# Patient Record
Sex: Male | Born: 1950 | Race: White | Hispanic: No | Marital: Married | Smoking: Former smoker
Health system: Southern US, Community
[De-identification: ages and names within clinical notes are randomized; demographics above are authoritative.]

## PROBLEM LIST (undated history)

## (undated) DIAGNOSIS — M1990 Unspecified osteoarthritis, unspecified site: Secondary | ICD-10-CM

## (undated) DIAGNOSIS — E039 Hypothyroidism, unspecified: Secondary | ICD-10-CM

## (undated) DIAGNOSIS — E785 Hyperlipidemia, unspecified: Secondary | ICD-10-CM

## (undated) DIAGNOSIS — G4730 Sleep apnea, unspecified: Secondary | ICD-10-CM

## (undated) DIAGNOSIS — T4145XA Adverse effect of unspecified anesthetic, initial encounter: Secondary | ICD-10-CM

## (undated) DIAGNOSIS — I639 Cerebral infarction, unspecified: Secondary | ICD-10-CM

## (undated) DIAGNOSIS — I10 Essential (primary) hypertension: Secondary | ICD-10-CM

## (undated) DIAGNOSIS — T8859XA Other complications of anesthesia, initial encounter: Secondary | ICD-10-CM

## (undated) DIAGNOSIS — F99 Mental disorder, not otherwise specified: Secondary | ICD-10-CM

## (undated) HISTORY — DX: Hyperlipidemia, unspecified: E78.5

## (undated) HISTORY — PX: COLONOSCOPY: SHX174

## (undated) HISTORY — PX: TONSILLECTOMY: SUR1361

## (undated) HISTORY — DX: Essential (primary) hypertension: I10

## (undated) HISTORY — PX: KNEE SURGERY: SHX244

---

## 2010-07-15 ENCOUNTER — Encounter: Payer: Self-pay | Admitting: Internal Medicine

## 2010-07-15 ENCOUNTER — Ambulatory Visit (INDEPENDENT_AMBULATORY_CARE_PROVIDER_SITE_OTHER): Payer: PRIVATE HEALTH INSURANCE | Admitting: Internal Medicine

## 2010-07-15 VITALS — BP 108/70 | HR 55 | Temp 97.9°F | Ht 76.0 in | Wt 259.0 lb

## 2010-07-15 DIAGNOSIS — R0989 Other specified symptoms and signs involving the circulatory and respiratory systems: Secondary | ICD-10-CM

## 2010-07-15 DIAGNOSIS — R0600 Dyspnea, unspecified: Secondary | ICD-10-CM

## 2010-07-15 NOTE — Progress Notes (Signed)
  Subjective:    Patient ID: Cameron Jackson, male    DOB: Jul 03, 1950, 60 y.o.   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  07/15/2010  Initial pulmonary office eval  Cc abrupt onset coug/sob one month ago with ear congestion then cough which developed one day after gagging episode attributed to eating / drinking too much the night before > dysphagia then cough progressively worse > ER admitted x 3 days dx aecopd with neg ct chest,  rx abx and steroids >  Now off and now 95% better.    At ov Pt denies any significant sore throat, dysphagia, itching, sneezing,  nasal congestion or excess/ purulent secretions,  fever, chills, sweats, unintended wt loss, pleuritic or exertional cp, hempoptysis, orthopnea pnd or leg swelling.    Also denies any obvious fluctuation of symptoms with weather or environmental changes or other aggravating or alleviating factors.      Review of Systems  Constitutional: Negative for fever, chills, activity change, appetite change and unexpected weight change.  HENT: Negative for congestion, sore throat, rhinorrhea, sneezing, trouble swallowing, dental problem, voice change and postnasal drip.   Eyes: Negative for visual disturbance.  Respiratory: Positive for cough and shortness of breath. Negative for choking.   Cardiovascular: Negative for chest pain and leg swelling.  Gastrointestinal: Negative for nausea, vomiting and abdominal pain.  Genitourinary: Negative for difficulty urinating.  Musculoskeletal: Negative for arthralgias.  Skin: Negative for rash.  Psychiatric/Behavioral: Negative for behavioral problems and confusion.       Objective:   Physical Exam Wt 259 07/15/2010  HEENT mild turbinate edema.  Oropharynx no thrush or excess pnd or cobblestoning.   No JVD or cervical adenopathy. Mild accessory muscle hypertrophy. Trachea midline, nl thryroid. Chest was hyperinflated by percussion with diminished breath sounds and moderate increased exp time without wheeze. Hoover sign positive at mid inspiration. Regular rate and rhythm without murmur gallop or rub or increase P2 or edema.  Abd: no hsm, nl excursion. Ext warm without cyanosis or clubbing.         Assessment & Plan:

## 2010-07-15 NOTE — Assessment & Plan Note (Signed)
DDX of  difficult airways managment all start with A and  include Adherence, Ace Inhibitors, Acid Reflux, Active Sinus Disease, Alpha 1 Antitripsin deficiency, Anxiety masquerading as Airways dz,  ABPA,  allergy(esp in young), Aspiration (esp in elderly), Adverse effects of DPI,  Active smokers, plus two Bs  = Bronchiectasis and Beta blocker use..and one C= CHF  In retrospect this was classic upper airway cough syndrome brought on by overt acid reflux in a pt on CCB with sign abd wt gain and dietary indiscretion.    Classic Upper airway cough syndrome, so named because it's frequently impossible to sort out how much is  CR/sinusitis with freq throat clearing (which can be related to primary GERD)   vs  causing  secondary (" extra esophageal")  GERD from wide swings in gastric pressure that occur with throat clearing, often  promoting self use of mint and menthol lozenges that reduce the lower esophageal sphincter tone and exacerbate the problem further in a cyclical fashion.   These are the same pts who not infrequently have failed to tolerate ace inhibitors,  dry powder inhalers or biphosphonates or report having reflux symptoms that don't respond to standard doses of PPI , and are easily confused as having aecopd or asthma flares,   ? Is there any sign underlying primary airway problem so needs diet/ lifestyle changes and return for pft's

## 2010-07-15 NOTE — Patient Instructions (Addendum)
GERD (REFLUX)  is an extremely common cause of respiratory symptoms, many times with no significant heartburn at all.    It can be treated with medication, but also with lifestyle changes including avoidance of late meals, excessive alcohol, smoking cessation, and avoid fatty foods, chocolate, peppermint, colas, red wine, and acidic juices such as orange juice.  NO MINT OR MENTHOL PRODUCTS SO NO COUGH DROPS  USE SUGARLESS CANDY INSTEAD (jolley ranchers or Stover's)  NO OIL BASED VITAMINS   You may need another blood pressure pill because amlodipine may interfere with the valve that prevents reflux  Please schedule a follow up office visit in 4 weeks, sooner if needed with PFT's and maybe cxr after we review the xrays from [place]  Late add: xrays, ct reviewed > no f/u needed

## 2010-07-28 ENCOUNTER — Encounter: Payer: Self-pay | Admitting: Internal Medicine

## 2010-08-12 ENCOUNTER — Ambulatory Visit (INDEPENDENT_AMBULATORY_CARE_PROVIDER_SITE_OTHER): Payer: PRIVATE HEALTH INSURANCE | Admitting: Internal Medicine

## 2010-08-12 ENCOUNTER — Encounter: Payer: Self-pay | Admitting: Internal Medicine

## 2010-08-12 VITALS — BP 118/80 | HR 85 | Temp 98.6°F | Ht 76.0 in | Wt 258.0 lb

## 2010-08-12 DIAGNOSIS — R0600 Dyspnea, unspecified: Secondary | ICD-10-CM

## 2010-08-12 DIAGNOSIS — R0609 Other forms of dyspnea: Secondary | ICD-10-CM

## 2010-08-12 LAB — PULMONARY FUNCTION TEST

## 2010-08-12 NOTE — Progress Notes (Signed)
PFT done today. 

## 2010-08-12 NOTE — Patient Instructions (Addendum)
Prilosec 20mg  Take 30-60 min before first meal of the day and Pepcid 20mg  one at bedtime for a month  Add chlortrimeton 4 mg one in the evening before bedtime   If not improved the first thing I would do is change norvasc to ARB or bystolic for a month and then return here if not satisfied.  NB the  ramp to expected improvement in symptoms from an empiric trial of acid reflux (and for that matter, worsening, if a chronic effective medication is stopped)  can be measured in weeks, not days, a common misconception because this is not the same as treating heartburn (no immediate cause and effect relationship)  so that response to therapy or lack thereof can be very difficult to assess especially if the patient is not adherent to the treatment plan which includes dietary restrictions.    If you are satisfied with your treatment plan let your doctor know and he/she can either refill your medications or you can return here when your prescription runs out.     If in any way you are not 100% satisfied,  please tell us.  If 100% better, tell your friends!

## 2010-08-12 NOTE — Assessment & Plan Note (Signed)
In retrospect clear now that this was  Classic Upper airway cough syndrome, so named because it's frequently impossible to sort out how much is  CR/sinusitis with freq throat clearing (which can be related to primary GERD)   vs  causing  secondary (" extra esophageal")  GERD from wide swings in gastric pressure that occur with throat clearing, often  promoting self use of mint and menthol lozenges that reduce the lower esophageal sphincter tone and exacerbate the problem further in a cyclical fashion.   These are the same pts who not infrequently have failed to tolerate ace inhibitors,  dry powder inhalers or biphosphonates or report having reflux symptoms that don't respond to standard doses of PPI , and are easily confused as having aecopd or asthma flares,   He does not have copd and probably not primary asthma either  Key is to stop the compulsive throat clearing and try max acid suppression then maybe off norvasc (which lowers LES and may promote non acid gerd/lpr)  Before next pulmonary re-eval

## 2010-08-12 NOTE — Progress Notes (Signed)
  Subjective:    Patient ID: Cameron Jackson, male    DOB: [birth year], 60 y.o.   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 and with sometimes with steps starting around fall 2011 assoc with about 25 lb wt gain x preceding 5 years with nl pft's 08/12/2010   07/15/2010 Initial pulmonary office eval Cc abrupt onset coug/sob one month ago with ear congestion then cough which developed one day after gagging episode attributed to eating / drinking too much the night before > dysphagia then cough progressively worse > ER admitted x 3 days dx aecopd with neg ct chest, rx abx and steroids > Now off and now 95% better  08/12/2010 ov /Ahman [facility] ov for pft's  On no meds x norvasc all better x throat drainage worse when lie down, no excess mucus. Sleeping ok without nocturnal  or early am exac of resp c/o's or need for noct saba.  Pt denies any significant sore throat, dysphagia, itching, sneezing,  nasal congestion or excess/ purulent nasal secretions,  fever, chills, sweats, unintended wt loss, pleuritic or exertional cp, hempoptysis, orthopnea pnd or leg swelling.    Also denies any obvious fluctuation of symptoms with weather or environmental changes or other aggravating or alleviating factors.     Review of Systems     Objective:   Physical Exam      Wt 259 07/15/2010  > 258 08/12/2010   amb wm with freq throat clearing HEENT: nl dentition, turbinates, and orophanx. Nl external ear canals without cough reflex   NECK :  without JVD/Nodes/TM/ nl carotid upstrokes bilaterally   LUNGS: no acc muscle use, clear to A and P bilaterally without cough on insp or exp maneuvers   CV:  RRR  no s3 or murmur or increase in P2, no edema   ABD:  soft and nontender with nl excursion in the supine position. No bruits or organomegaly, bowel sounds nl  MS:  warm without deformities, calf tenderness, cyanosis or  clubbing  SKIN: warm and dry without lesions            Assessment & Plan:

## 2010-08-25 ENCOUNTER — Encounter: Payer: Self-pay | Admitting: Internal Medicine

## 2013-02-01 ENCOUNTER — Other Ambulatory Visit: Payer: Self-pay | Admitting: Neurosurgery

## 2013-02-01 DIAGNOSIS — M4802 Spinal stenosis, cervical region: Secondary | ICD-10-CM

## 2013-02-07 ENCOUNTER — Ambulatory Visit
Admission: RE | Admit: 2013-02-07 | Discharge: 2013-02-07 | Disposition: A | Discharge status: Home / Self Care | Payer: PRIVATE HEALTH INSURANCE | Source: Ambulatory Visit | Attending: Neurosurgery | Admitting: Neurosurgery

## 2013-02-07 VITALS — BP 150/61 | HR 56

## 2013-02-07 DIAGNOSIS — M4802 Spinal stenosis, cervical region: Secondary | ICD-10-CM

## 2013-02-07 MED ORDER — ONDANSETRON HCL 4 MG/2ML IJ SOLN: 4.0000 mg | Freq: Four times a day (QID) | INTRAMUSCULAR | Status: DC | PRN

## 2013-02-07 MED ORDER — DIAZEPAM 5 MG PO TABS
10.0000 mg | ORAL_TABLET | Freq: Once | ORAL | Status: AC
  Administered 2013-02-07: 10 mg via ORAL

## 2013-02-07 MED ORDER — IOHEXOL 300 MG/ML  SOLN
10.0000 mL | Freq: Once | INTRAMUSCULAR | Status: AC | PRN
  Administered 2013-02-07: 10 mL via INTRAVENOUS

## 2013-02-07 NOTE — Progress Notes (Signed)
Pt states he has been off citalopram and lexapro since last Saturday. Discharge instructions explained to pt.

## 2013-02-21 ENCOUNTER — Other Ambulatory Visit: Payer: Self-pay | Admitting: Neurosurgery

## 2013-02-23 ENCOUNTER — Encounter (HOSPITAL_COMMUNITY): Payer: Self-pay | Admitting: Pharmacy Technician

## 2013-02-26 ENCOUNTER — Encounter (HOSPITAL_COMMUNITY)
Admission: RE | Admit: 2013-02-26 | Discharge: 2013-02-26 | Disposition: A | Discharge status: Home / Self Care | Payer: PRIVATE HEALTH INSURANCE | Source: Ambulatory Visit | Attending: Neurosurgery | Admitting: Neurosurgery

## 2013-02-26 ENCOUNTER — Ambulatory Visit (HOSPITAL_COMMUNITY)
Admission: RE | Admit: 2013-02-26 | Discharge: 2013-02-26 | Disposition: A | Discharge status: Home / Self Care | Payer: PRIVATE HEALTH INSURANCE | Source: Ambulatory Visit | Attending: Neurosurgery | Admitting: Neurosurgery

## 2013-02-26 ENCOUNTER — Encounter (HOSPITAL_COMMUNITY): Payer: Self-pay

## 2013-02-26 DIAGNOSIS — Z01818 Encounter for other preprocedural examination: Secondary | ICD-10-CM | POA: Insufficient documentation

## 2013-02-26 HISTORY — DX: Hypothyroidism, unspecified: E03.9

## 2013-02-26 HISTORY — DX: Cerebral infarction, unspecified: I63.9

## 2013-02-26 HISTORY — DX: Adverse effect of unspecified anesthetic, initial encounter: T41.45XA

## 2013-02-26 HISTORY — DX: Sleep apnea, unspecified: G47.30

## 2013-02-26 HISTORY — DX: Unspecified osteoarthritis, unspecified site: M19.90

## 2013-02-26 HISTORY — DX: Mental disorder, not otherwise specified: F99

## 2013-02-26 HISTORY — DX: Other complications of anesthesia, initial encounter: T88.59XA

## 2013-02-26 LAB — CBC
Platelets: 216 10*3/uL (ref 150–400)
RBC: 4.67 MIL/uL (ref 4.22–5.81)
RDW: 12.4 % (ref 11.5–15.5)
WBC: 5.9 10*3/uL (ref 4.0–10.5)

## 2013-02-26 LAB — BASIC METABOLIC PANEL
CO2: 26 mEq/L (ref 19–32)
Calcium: 9.2 mg/dL (ref 8.4–10.5)
Chloride: 103 mEq/L (ref 96–112)
Creatinine, Ser: 1.05 mg/dL (ref 0.50–1.35)
GFR calc Af Amer: 87 mL/min — ABNORMAL LOW (ref >90)
Potassium: 4 mEq/L (ref 3.5–5.1)
Sodium: 138 mEq/L (ref 135–145)

## 2013-02-26 LAB — SURGICAL PCR SCREEN
MRSA, PCR: NEGATIVE
Staphylococcus aureus: POSITIVE — AB

## 2013-02-26 NOTE — Pre-Procedure Instructions (Signed)
Cabe Lashley  02/26/2013   Your procedure is scheduled on:  Wednesday, November 26th.  Report to [facility], Main Entrance / Entrance "A" at 11:15 AM.  Call this number if you have problems the morning of surgery: [phone]   Remember:   Do not eat food or drink liquids after midnight on Tuesday.  Take these medicines the morning of surgery with A SIP OF WATER: Escitalopram (Lexapro), Levothyroxine (Synthroid).    Do not wear jewelry, make-up or nail polish.  Do not wear lotions, powders, or perfumes. You may wear deodorant.   Men may shave face and neck.  Do not bring valuables to the hospital.  [facility] is not responsible for any belongings or valuables.               Contacts, dentures or bridgework may not be worn into surgery.  Leave suitcase in the car. After surgery it may be brought to your room.  For patients admitted to the hospital, discharge time is determined by your treatment team.               Patients discharged the day of surgery will not be allowed to drive home.  Name and phone number of your driver: -  Special Instructions: Shower using CHG 2 nights before surgery and the night before surgery.  If you shower the day of surgery use CHG.  Use special wash - you have one bottle of CHG for all showers.  You should use approximately 1/3 of the bottle for each shower. N/A   Please read over the following fact sheets that you were given: Pain Booklet, Coughing and Deep Breathing and Surgical Site Infection Prevention

## 2013-02-26 NOTE — Progress Notes (Signed)
Patientt states that 20 - 30 years ago he had hypothermia after surgery and woke up in an incubator.  Patient reported that he has become aggressive with staff when he had conscious sedation,  Endoscopy at [facility].  I requested discharge notes from [facility] Endoscopy visit.

## 2013-02-26 NOTE — Pre-Procedure Instructions (Addendum)
Shaheem Pichon  02/26/2013   Your procedure is scheduled on:  Wednesday, November 26th.  Report to [facility], Main Entrance / Entrance "A" at 11:15 AM.  Call this number if you have problems the morning of surgery: [phone]   Remember:   Do not eat food or drink liquids after midnight on Tuesday.  Take these medicines the morning of surgery with A SIP OF WATER: Escitalopram (Lexapro), Levothyroxine (Synthroid).            Stop taking Aspirin, Coumadin, Plavix, Effient and Herbal medications.  Do not take any NSAIDs ie: Ibuprofen,  Advil,Naproxen or any medication containing Aspirin.   Do not wear jewelry, make-up or nail polish.  Do not wear lotions, powders, or perfumes. You may wear deodorant.   Men may shave face and neck.  Do not bring valuables to the hospital.  [facility] is not responsible for any belongings or valuables.               Contacts, dentures or bridgework may not be worn into surgery.  Leave suitcase in the car. After surgery it may be brought to your room.  For patients admitted to the hospital, discharge time is determined by your treatment team.               Patients discharged the day of surgery will not be allowed to drive home.  Name and phone number of your driver: -  Special Instructions: Shower using CHG 2 nights before surgery and the night before surgery.  If you shower the day of surgery use CHG.  Use special wash - you have one bottle of CHG for all showers.  You should use approximately 1/3 of the bottle for each shower. N/A   Please read over the following fact sheets that you were given: Pain Booklet, Coughing and Deep Breathing and Surgical Site Infection Prevention

## 2013-02-27 NOTE — Progress Notes (Signed)
Surgery moved from 1415 to 1520. Per Rosana Berger., RN, Dr. Cassandria Santee office will inform pt of time change; therefore, pt not called.

## 2013-02-27 NOTE — Progress Notes (Signed)
Re- requested sleep study  From dr. Blenda Nicely   .

## 2013-02-27 NOTE — Progress Notes (Signed)
Nurse called patient to see if Dr. Cassandria Santee office called to inform patient of time change. Patient stated no one had called him. Nurse instructed patient to arrive at 1220 instead of 1115. Patient verbalized understanding.

## 2013-02-28 ENCOUNTER — Inpatient Hospital Stay (HOSPITAL_COMMUNITY)
Admission: RE | Admit: 2013-02-28 | Discharge: 2013-03-02 | DRG: 473 | Disposition: A | Discharge status: Home / Self Care | Payer: PRIVATE HEALTH INSURANCE | Source: Ambulatory Visit | Attending: Neurosurgery | Admitting: Neurosurgery

## 2013-02-28 ENCOUNTER — Inpatient Hospital Stay (HOSPITAL_COMMUNITY): Payer: PRIVATE HEALTH INSURANCE | Admitting: Anesthesiology

## 2013-02-28 ENCOUNTER — Encounter (HOSPITAL_COMMUNITY): Payer: Self-pay | Admitting: Anesthesiology

## 2013-02-28 ENCOUNTER — Encounter (HOSPITAL_COMMUNITY)
Admission: RE | Disposition: A | Discharge status: Home / Self Care | Payer: Self-pay | Source: Ambulatory Visit | Attending: Neurosurgery

## 2013-02-28 ENCOUNTER — Encounter (HOSPITAL_COMMUNITY): Payer: PRIVATE HEALTH INSURANCE | Admitting: Vascular Surgery

## 2013-02-28 ENCOUNTER — Inpatient Hospital Stay (HOSPITAL_COMMUNITY): Payer: PRIVATE HEALTH INSURANCE

## 2013-02-28 DIAGNOSIS — M4802 Spinal stenosis, cervical region: Secondary | ICD-10-CM | POA: Diagnosis present

## 2013-02-28 DIAGNOSIS — Z91038 Other insect allergy status: Secondary | ICD-10-CM

## 2013-02-28 DIAGNOSIS — Z7982 Long term (current) use of aspirin: Secondary | ICD-10-CM

## 2013-02-28 DIAGNOSIS — G4730 Sleep apnea, unspecified: Secondary | ICD-10-CM | POA: Diagnosis present

## 2013-02-28 DIAGNOSIS — E785 Hyperlipidemia, unspecified: Secondary | ICD-10-CM | POA: Diagnosis present

## 2013-02-28 DIAGNOSIS — Z8673 Personal history of transient ischemic attack (TIA), and cerebral infarction without residual deficits: Secondary | ICD-10-CM

## 2013-02-28 DIAGNOSIS — E039 Hypothyroidism, unspecified: Secondary | ICD-10-CM | POA: Diagnosis present

## 2013-02-28 DIAGNOSIS — I10 Essential (primary) hypertension: Secondary | ICD-10-CM | POA: Diagnosis present

## 2013-02-28 DIAGNOSIS — Z79899 Other long term (current) drug therapy: Secondary | ICD-10-CM

## 2013-02-28 DIAGNOSIS — Z87891 Personal history of nicotine dependence: Secondary | ICD-10-CM

## 2013-02-28 DIAGNOSIS — Z8249 Family history of ischemic heart disease and other diseases of the circulatory system: Secondary | ICD-10-CM

## 2013-02-28 HISTORY — PX: ANTERIOR CERVICAL DECOMPRESSION/DISCECTOMY FUSION 4 LEVELS: SHX5556

## 2013-02-28 SURGERY — ANTERIOR CERVICAL DECOMPRESSION/DISCECTOMY FUSION 4 LEVELS
Anesthesia: General | Site: Neck | Wound class: Clean

## 2013-02-28 MED ORDER — DEXTROSE 5 % IV SOLN
3.0000 g | INTRAVENOUS | Status: AC
  Administered 2013-02-28: 3 g via INTRAVENOUS
  Filled 2013-02-28: qty 3000

## 2013-02-28 MED ORDER — HYDROMORPHONE HCL PF 1 MG/ML IJ SOLN
0.2500 mg | INTRAMUSCULAR | Status: DC | PRN
  Administered 2013-02-28 (×2): 0.5 mg via INTRAVENOUS

## 2013-02-28 MED ORDER — SODIUM CHLORIDE 0.9 % IJ SOLN
3.0000 mL | Freq: Two times a day (BID) | INTRAMUSCULAR | Status: DC
  Administered 2013-03-01 – 2013-03-02 (×2): 3 mL via INTRAVENOUS

## 2013-02-28 MED ORDER — PROPOFOL 10 MG/ML IV BOLUS
INTRAVENOUS | Status: DC | PRN
  Administered 2013-02-28: 60 mg via INTRAVENOUS
  Administered 2013-02-28: 240 mg via INTRAVENOUS

## 2013-02-28 MED ORDER — PHENOL 1.4 % MT LIQD
1.0000 | OROMUCOSAL | Status: DC | PRN
  Filled 2013-02-28 (×2): qty 177

## 2013-02-28 MED ORDER — EPHEDRINE SULFATE 50 MG/ML IJ SOLN
INTRAMUSCULAR | Status: DC | PRN
  Administered 2013-02-28 (×2): 10 mg via INTRAVENOUS

## 2013-02-28 MED ORDER — LACTATED RINGERS IV SOLN
INTRAVENOUS | Status: DC | PRN
  Administered 2013-02-28 (×2): via INTRAVENOUS

## 2013-02-28 MED ORDER — ACETAMINOPHEN 650 MG RE SUPP: 650.0000 mg | RECTAL | Status: DC | PRN

## 2013-02-28 MED ORDER — MIDAZOLAM HCL 5 MG/5ML IJ SOLN
INTRAMUSCULAR | Status: DC | PRN
  Administered 2013-02-28: 2 mg via INTRAVENOUS

## 2013-02-28 MED ORDER — ACETAMINOPHEN 325 MG PO TABS: 650.0000 mg | ORAL_TABLET | ORAL | Status: DC | PRN

## 2013-02-28 MED ORDER — OXYCODONE HCL 5 MG PO TABS: 5.0000 mg | ORAL_TABLET | Freq: Once | ORAL | Status: DC | PRN

## 2013-02-28 MED ORDER — CEFAZOLIN SODIUM-DEXTROSE 2-3 GM-% IV SOLR: 2.0000 g | INTRAVENOUS | Status: DC

## 2013-02-28 MED ORDER — CEFAZOLIN SODIUM 1-5 GM-% IV SOLN
1.0000 g | Freq: Three times a day (TID) | INTRAVENOUS | Status: AC
  Administered 2013-03-01 (×2): 1 g via INTRAVENOUS
  Filled 2013-02-28 (×2): qty 50

## 2013-02-28 MED ORDER — HYDROMORPHONE HCL PF 1 MG/ML IJ SOLN
INTRAMUSCULAR | Status: AC
  Filled 2013-02-28: qty 1

## 2013-02-28 MED ORDER — NEOSTIGMINE METHYLSULFATE 1 MG/ML IJ SOLN
INTRAMUSCULAR | Status: DC | PRN
  Administered 2013-02-28: 3 mg via INTRAVENOUS

## 2013-02-28 MED ORDER — MORPHINE SULFATE (PF) 1 MG/ML IV SOLN
INTRAVENOUS | Status: DC
  Administered 2013-02-28: 22:00:00 via INTRAVENOUS
  Administered 2013-03-01: 19.5 mg via INTRAVENOUS
  Administered 2013-03-01: 18.67 mg via INTRAVENOUS
  Administered 2013-03-01: 03:00:00 via INTRAVENOUS
  Administered 2013-03-01: 9 mg via INTRAVENOUS
  Filled 2013-02-28 (×2): qty 25

## 2013-02-28 MED ORDER — ONDANSETRON HCL 4 MG/2ML IJ SOLN: 4.0000 mg | Freq: Four times a day (QID) | INTRAMUSCULAR | Status: DC | PRN

## 2013-02-28 MED ORDER — LEVOTHYROXINE SODIUM 125 MCG PO TABS
125.0000 ug | ORAL_TABLET | Freq: Every day | ORAL | Status: DC
  Administered 2013-03-01 – 2013-03-02 (×2): 125 ug via ORAL
  Filled 2013-02-28 (×3): qty 1

## 2013-02-28 MED ORDER — ZOLPIDEM TARTRATE 5 MG PO TABS: 10.0000 mg | ORAL_TABLET | Freq: Every evening | ORAL | Status: DC | PRN

## 2013-02-28 MED ORDER — LACTATED RINGERS IV SOLN
INTRAVENOUS | Status: DC
  Administered 2013-02-28 (×2): via INTRAVENOUS

## 2013-02-28 MED ORDER — ROCURONIUM BROMIDE 100 MG/10ML IV SOLN
INTRAVENOUS | Status: DC | PRN
  Administered 2013-02-28: 50 mg via INTRAVENOUS
  Administered 2013-02-28: 20 mg via INTRAVENOUS

## 2013-02-28 MED ORDER — SODIUM CHLORIDE 0.9 % IJ SOLN: 3.0000 mL | INTRAMUSCULAR | Status: DC | PRN

## 2013-02-28 MED ORDER — DIPHENHYDRAMINE HCL 12.5 MG/5ML PO ELIX: 12.5000 mg | ORAL_SOLUTION | Freq: Four times a day (QID) | ORAL | Status: DC | PRN

## 2013-02-28 MED ORDER — DIAZEPAM 5 MG PO TABS
5.0000 mg | ORAL_TABLET | Freq: Four times a day (QID) | ORAL | Status: DC | PRN
  Administered 2013-02-28 – 2013-03-02 (×5): 5 mg via ORAL
  Filled 2013-02-28 (×5): qty 1

## 2013-02-28 MED ORDER — METOCLOPRAMIDE HCL 5 MG/ML IJ SOLN: 10.0000 mg | Freq: Once | INTRAMUSCULAR | Status: DC | PRN

## 2013-02-28 MED ORDER — DIPHENHYDRAMINE HCL 50 MG/ML IJ SOLN: 12.5000 mg | Freq: Four times a day (QID) | INTRAMUSCULAR | Status: DC | PRN

## 2013-02-28 MED ORDER — NALOXONE HCL 0.4 MG/ML IJ SOLN: 0.4000 mg | INTRAMUSCULAR | Status: DC | PRN

## 2013-02-28 MED ORDER — ESCITALOPRAM OXALATE 10 MG PO TABS
10.0000 mg | ORAL_TABLET | Freq: Every day | ORAL | Status: DC
  Administered 2013-03-01 – 2013-03-02 (×2): 10 mg via ORAL
  Filled 2013-02-28 (×2): qty 1

## 2013-02-28 MED ORDER — DEXAMETHASONE SODIUM PHOSPHATE 4 MG/ML IJ SOLN
4.0000 mg | Freq: Four times a day (QID) | INTRAMUSCULAR | Status: DC
  Filled 2013-02-28 (×10): qty 1

## 2013-02-28 MED ORDER — MENTHOL 3 MG MT LOZG
1.0000 | LOZENGE | OROMUCOSAL | Status: DC | PRN
  Filled 2013-02-28: qty 9

## 2013-02-28 MED ORDER — TAMSULOSIN HCL 0.4 MG PO CAPS
0.8000 mg | ORAL_CAPSULE | Freq: Every day | ORAL | Status: DC
  Administered 2013-03-01 – 2013-03-02 (×2): 0.8 mg via ORAL
  Filled 2013-02-28 (×2): qty 2

## 2013-02-28 MED ORDER — SUCCINYLCHOLINE CHLORIDE 20 MG/ML IJ SOLN
INTRAMUSCULAR | Status: DC | PRN
  Administered 2013-02-28: 100 mg via INTRAVENOUS

## 2013-02-28 MED ORDER — SODIUM CHLORIDE 0.9 % IV SOLN: 250.0000 mL | INTRAVENOUS | Status: DC

## 2013-02-28 MED ORDER — MORPHINE SULFATE (PF) 1 MG/ML IV SOLN
INTRAVENOUS | Status: AC
  Filled 2013-02-28: qty 25

## 2013-02-28 MED ORDER — ONDANSETRON HCL 4 MG/2ML IJ SOLN: 4.0000 mg | INTRAMUSCULAR | Status: DC | PRN

## 2013-02-28 MED ORDER — OXYCODONE HCL 5 MG/5ML PO SOLN: 5.0000 mg | Freq: Once | ORAL | Status: DC | PRN

## 2013-02-28 MED ORDER — SODIUM CHLORIDE 0.9 % IV SOLN
INTRAVENOUS | Status: DC
  Administered 2013-02-28 – 2013-03-01 (×2): via INTRAVENOUS

## 2013-02-28 MED ORDER — DEXAMETHASONE 4 MG PO TABS
4.0000 mg | ORAL_TABLET | Freq: Four times a day (QID) | ORAL | Status: DC
  Administered 2013-03-01 – 2013-03-02 (×7): 4 mg via ORAL
  Filled 2013-02-28 (×10): qty 1

## 2013-02-28 MED ORDER — OXYCODONE-ACETAMINOPHEN 5-325 MG PO TABS
1.0000 | ORAL_TABLET | ORAL | Status: DC | PRN
  Administered 2013-03-01 – 2013-03-02 (×5): 2 via ORAL
  Filled 2013-02-28 (×5): qty 2

## 2013-02-28 MED ORDER — THROMBIN 20000 UNITS EX SOLR
CUTANEOUS | Status: DC | PRN
  Administered 2013-02-28: 18:00:00 via TOPICAL

## 2013-02-28 MED ORDER — DEXAMETHASONE SODIUM PHOSPHATE 4 MG/ML IJ SOLN
INTRAMUSCULAR | Status: DC | PRN
  Administered 2013-02-28: 4 mg via INTRAVENOUS

## 2013-02-28 MED ORDER — GLYCOPYRROLATE 0.2 MG/ML IJ SOLN
INTRAMUSCULAR | Status: DC | PRN
  Administered 2013-02-28: 0.4 mg via INTRAVENOUS
  Administered 2013-02-28: 0.2 mg via INTRAVENOUS

## 2013-02-28 MED ORDER — SODIUM CHLORIDE 0.9 % IJ SOLN: 9.0000 mL | INTRAMUSCULAR | Status: DC | PRN

## 2013-02-28 MED ORDER — FENTANYL CITRATE 0.05 MG/ML IJ SOLN
INTRAMUSCULAR | Status: DC | PRN
  Administered 2013-02-28: 150 ug via INTRAVENOUS
  Administered 2013-02-28: 100 ug via INTRAVENOUS
  Administered 2013-02-28: 150 ug via INTRAVENOUS
  Administered 2013-02-28: 100 ug via INTRAVENOUS

## 2013-02-28 MED ORDER — 0.9 % SODIUM CHLORIDE (POUR BTL) OPTIME
TOPICAL | Status: DC | PRN
  Administered 2013-02-28: 1000 mL

## 2013-02-28 MED ORDER — ONDANSETRON HCL 4 MG/2ML IJ SOLN
INTRAMUSCULAR | Status: DC | PRN
  Administered 2013-02-28: 4 mg via INTRAVENOUS

## 2013-02-28 SURGICAL SUPPLY — 58 items
BANDAGE GAUZE ELAST BULKY 4 IN (GAUZE/BANDAGES/DRESSINGS) ×4 IMPLANT
BENZOIN TINCTURE PRP APPL 2/3 (GAUZE/BANDAGES/DRESSINGS) ×2 IMPLANT
BIT DRILL SM SPINE QC 12 (BIT) ×2 IMPLANT
BLADE ULTRA TIP 2M (BLADE) ×2 IMPLANT
BUR BARREL STRAIGHT FLUTE 4.0 (BURR) IMPLANT
BUR MATCHSTICK NEURO 3.0 LAGG (BURR) ×2 IMPLANT
CANISTER SUCT 3000ML (MISCELLANEOUS) ×2 IMPLANT
CONT SPEC 4OZ CLIKSEAL STRL BL (MISCELLANEOUS) ×2 IMPLANT
COVER MAYO STAND STRL (DRAPES) ×2 IMPLANT
DRAIN JACKSON PRATT 10MM FLAT (MISCELLANEOUS) ×2 IMPLANT
DRAPE C-ARM 42X72 X-RAY (DRAPES) ×4 IMPLANT
DRAPE LAPAROTOMY 100X72 PEDS (DRAPES) ×2 IMPLANT
DRAPE MICROSCOPE LEICA (MISCELLANEOUS) ×2 IMPLANT
DRAPE POUCH INSTRU U-SHP 10X18 (DRAPES) ×2 IMPLANT
DRAPE PROXIMA HALF (DRAPES) IMPLANT
DURAPREP 6ML APPLICATOR 50/CS (WOUND CARE) ×2 IMPLANT
ELECT REM PT RETURN 9FT ADLT (ELECTROSURGICAL) ×2
ELECTRODE REM PT RTRN 9FT ADLT (ELECTROSURGICAL) ×1 IMPLANT
EVACUATOR SILICONE 100CC (DRAIN) ×2 IMPLANT
GAUZE SPONGE 4X4 16PLY XRAY LF (GAUZE/BANDAGES/DRESSINGS) IMPLANT
GLOVE BIO SURGEON STRL SZ 6.5 (GLOVE) ×2 IMPLANT
GLOVE BIOGEL M 8.0 STRL (GLOVE) ×2 IMPLANT
GLOVE BIOGEL PI IND STRL 7.5 (GLOVE) ×1 IMPLANT
GLOVE BIOGEL PI INDICATOR 7.5 (GLOVE) ×1
GLOVE ECLIPSE 7.5 STRL STRAW (GLOVE) ×2 IMPLANT
GLOVE EXAM NITRILE LRG STRL (GLOVE) IMPLANT
GLOVE EXAM NITRILE MD LF STRL (GLOVE) IMPLANT
GLOVE EXAM NITRILE XL STR (GLOVE) IMPLANT
GLOVE EXAM NITRILE XS STR PU (GLOVE) IMPLANT
GOWN BRE IMP SLV AUR LG STRL (GOWN DISPOSABLE) ×6 IMPLANT
GOWN BRE IMP SLV AUR XL STRL (GOWN DISPOSABLE) ×2 IMPLANT
GOWN STRL REIN 2XL LVL4 (GOWN DISPOSABLE) IMPLANT
HEAD HALTER (SOFTGOODS) ×2 IMPLANT
HEMOSTAT POWDER KIT SURGIFOAM (HEMOSTASIS) IMPLANT
KIT BASIN OR (CUSTOM PROCEDURE TRAY) ×2 IMPLANT
KIT ROOM TURNOVER OR (KITS) ×2 IMPLANT
NEEDLE SPNL 22GX3.5 QUINCKE BK (NEEDLE) ×2 IMPLANT
NS IRRIG 1000ML POUR BTL (IV SOLUTION) ×2 IMPLANT
PACK LAMINECTOMY NEURO (CUSTOM PROCEDURE TRAY) ×2 IMPLANT
PATTIES SURGICAL .5 X1 (DISPOSABLE) ×2 IMPLANT
PLATE ANT CERV XTEND 4 LV 72 (Plate) ×2 IMPLANT
PUTTY DBX 1CC (Putty) ×2 IMPLANT
PUTTY DBX 1CC DEPUY (Putty) ×1 IMPLANT
RUBBERBAND STERILE (MISCELLANEOUS) ×4 IMPLANT
SCREW XTD VAR 4.2 SELF TAP 12 (Screw) ×20 IMPLANT
SPACER ACDF SM LORDOTIC 7 (Spacer) ×6 IMPLANT
SPACER CERVICAL SM 6MM (Spacer) ×2 IMPLANT
SPONGE GAUZE 4X4 12PLY (GAUZE/BANDAGES/DRESSINGS) ×2 IMPLANT
SPONGE INTESTINAL PEANUT (DISPOSABLE) ×4 IMPLANT
SPONGE SURGIFOAM ABS GEL 100 (HEMOSTASIS) ×2 IMPLANT
STRIP CLOSURE SKIN 1/2X4 (GAUZE/BANDAGES/DRESSINGS) ×2 IMPLANT
SUT VIC AB 3-0 SH 8-18 (SUTURE) ×2 IMPLANT
SYR 20ML ECCENTRIC (SYRINGE) ×2 IMPLANT
TAPE CLOTH SURG 4X10 WHT LF (GAUZE/BANDAGES/DRESSINGS) ×2 IMPLANT
TOWEL OR 17X24 6PK STRL BLUE (TOWEL DISPOSABLE) ×2 IMPLANT
TOWEL OR 17X26 10 PK STRL BLUE (TOWEL DISPOSABLE) ×2 IMPLANT
TRAY FOLEY CATH 16FRSI W/METER (SET/KITS/TRAYS/PACK) ×2 IMPLANT
WATER STERILE IRR 1000ML POUR (IV SOLUTION) ×2 IMPLANT

## 2013-02-28 NOTE — Preoperative (Signed)
Beta Blockers   Reason not to administer Beta Blockers:Not Applicable 

## 2013-02-28 NOTE — Anesthesia Postprocedure Evaluation (Signed)
Anesthesia Post Note  Patient: Cameron Jackson  Procedure(s) Performed: Procedure(s) (LRB): ANTERIOR CERVICAL DECOMPRESSION/DISCECTOMY FUSION 4 LEVELS (N/A)  Anesthesia type: general  Patient location: PACU  Post pain: Pain level controlled  Post assessment: Patient's Cardiovascular Status Stable  Last Vitals:  Filed Vitals:   02/28/13 2300  BP: 166/75  Pulse: 75  Temp: 37.3 C  Resp: 11    Post vital signs: Reviewed and stable  Level of consciousness: sedated  Complications: No apparent anesthesia complications

## 2013-02-28 NOTE — Transfer of Care (Signed)
Immediate Anesthesia Transfer of Care Note  Patient: Cameron Jackson  Procedure(s) Performed: Procedure(s) with comments: ANTERIOR CERVICAL DECOMPRESSION/DISCECTOMY FUSION 4 LEVELS (N/A) - C3-4 C4-5 C5-6 C6-7 Anterior cervical decompression/diskectomy/fusion  Patient Location: PACU  Anesthesia Type:General  Level of Consciousness: awake, alert , oriented, patient cooperative and responds to stimulation  Airway & Oxygen Therapy: Patient Spontanous Breathing and Patient connected to nasal cannula oxygen  Post-op Assessment: Report given to PACU RN, Post -op Vital signs reviewed and stable and Patient moving all extremities X 4  Post vital signs: Reviewed and stable  Complications: No apparent anesthesia complications

## 2013-02-28 NOTE — Anesthesia Preprocedure Evaluation (Addendum)
Anesthesia Evaluation  Patient identified by MRN, date of birth, ID band Patient awake    Reviewed: Allergy & Precautions, H&P , NPO status , Patient's Chart, lab work & pertinent test results, reviewed documented beta blocker date and time   History of Anesthesia Complications (+) history of anesthetic complications  Airway Mallampati: II TM Distance: >3 FB Neck ROM: full    Dental  (+) Dental Advisory Given   Pulmonary shortness of breath and with exertion, sleep apnea , former smoker,  breath sounds clear to auscultation        Cardiovascular hypertension, Pt. on medications Rhythm:regular     Neuro/Psych PSYCHIATRIC DISORDERS CVA negative psych ROS   GI/Hepatic negative GI ROS, Neg liver ROS,   Endo/Other  Hypothyroidism   Renal/GU negative Renal ROS  negative genitourinary   Musculoskeletal   Abdominal   Peds  Hematology negative hematology ROS (+)   Anesthesia Other Findings See surgeon's H&P   Reproductive/Obstetrics negative OB ROS                          Anesthesia Physical Anesthesia Plan  ASA: II  Anesthesia Plan: General   Post-op Pain Management:    Induction: Intravenous  Airway Management Planned: Oral ETT  Additional Equipment:   Intra-op Plan:   Post-operative Plan: Extubation in OR  Informed Consent: I have reviewed the patients History and Physical, chart, labs and discussed the procedure including the risks, benefits and alternatives for the proposed anesthesia with the patient or authorized representative who has indicated his/her understanding and acceptance.   Dental Advisory Given  Plan Discussed with: CRNA and Surgeon  Anesthesia Plan Comments:         Anesthesia Quick Evaluation

## 2013-02-28 NOTE — Progress Notes (Signed)
Op note [phone]

## 2013-02-28 NOTE — Anesthesia Procedure Notes (Signed)
Procedure Name: Intubation Date/Time: 02/28/2013 5:29 PM Performed by: Coralee Rud Pre-anesthesia Checklist: Patient identified, Emergency Drugs available, Suction available and Patient being monitored Patient Re-evaluated:Patient Re-evaluated prior to inductionOxygen Delivery Method: Circle system utilized Preoxygenation: Pre-oxygenation with 100% oxygen Intubation Type: IV induction Ventilation: Mask ventilation without difficulty Laryngoscope size: Elective Glidescope. Grade View: Grade I Tube type: Oral Tube size: 8.0 mm Number of attempts: 1 Airway Equipment and Method: Stylet and Video-laryngoscopy Placement Confirmation: ETT inserted through vocal cords under direct vision and positive ETCO2 Secured at: 22 cm Tube secured with: Tape Comments: Elective Glidedscope 2nd to limited ROMdue to pain and weakness, full dentition, overbite and small mouth.

## 2013-02-28 NOTE — H&P (Signed)
Cameron Jackson is an 62 y.o. male.   Chief Complaint: NECK PAIN HPI: patient seen in my office with neck pain with radiation to the left shoulder,arm and hand.the pais has been going for many years but lately is getting worse.  Past Medical History  Diagnosis Date  . Hyperlipidemia   . Hypertension     not on medication now. 140/80s is high for patient.  . Hypothyroidism   . Mental disorder   . Sleep apnea     CPAP  . Stroke     "mini" stroke per MRI  . Arthritis     Osteo Arthritis  . Complication of anesthesia     hypothermia- 20 years ago.  Aggessive behavior - with `endo' years ago.    Past Surgical History  Procedure Laterality Date  . Knee surgery  [id number]    left  . Colonoscopy    . Tonsillectomy      Family History  Problem Relation Age of Onset  . Allergies Father   . Allergies Brother   . Heart disease Mother   . Heart disease Father    Social History:  reports that he quit smoking about 31 years ago. His smoking use included Cigarettes. He has a 22 pack-year smoking history. He quit smokeless tobacco use about 8 years ago. His smokeless tobacco use included Chew. He reports that he does not drink alcohol or use illicit drugs.  Allergies:  Allergies  Allergen Reactions  . Other Swelling    Bee Stings- swelling of face.    Medications Prior to Admission  Medication Sig Dispense Refill  . aspirin 81 MG tablet Take 81 mg by mouth daily.        . Cholecalciferol (HM VITAMIN D3) 4000 UNITS CAPS Take 4,000 Units by mouth daily.      . cholecalciferol (VITAMIN D) 1000 UNITS tablet Take 6,000 Units by mouth daily.      Marland Kitchen escitalopram (LEXAPRO) 10 MG tablet Take 10 mg by mouth daily.       Marland Kitchen HYDROcodone-acetaminophen (NORCO/VICODIN) 5-325 MG per tablet Take 1 tablet by mouth every 6 (six) hours as needed for moderate pain.      Marland Kitchen levothyroxine (SYNTHROID, LEVOTHROID) 125 MCG tablet Take 125 mcg by mouth daily before breakfast.      . naproxen (NAPROSYN) 500  MG tablet Take 500 mg by mouth 2 (two) times daily with a meal.      . tamsulosin (FLOMAX) 0.4 MG CAPS capsule Take 0.4 mg by mouth.      . zolpidem (AMBIEN CR) 12.5 MG CR tablet Take 12.5 mg by mouth at bedtime.        No results found for this or any previous visit (from the past 48 hour(s)). No results found.  Review of Systems  Constitutional: Negative.   Eyes: Negative.   Respiratory: Negative.   Cardiovascular: Negative.   Gastrointestinal: Negative.   Genitourinary: Negative.   Musculoskeletal: Positive for neck pain.  Skin: Negative.   Neurological: Positive for sensory change and focal weakness.  Endo/Heme/Allergies: Negative.   Psychiatric/Behavioral: Negative.     Blood pressure 142/98, pulse 50, temperature 98.1 F (36.7 C), resp. rate 16, SpO2 100.00%. Physical Exam hent, nl. Neck, decrease of flexibility secondary to pain. NEURO WEAKNESS OF LEFT BICEPS AND WRIST EXTENSOR, DTR, NL. myeolo shows severe stenosis at cervical 56,67 with foraminal narrowing at c34, 45.   Assessment/Plan Patient to have decompresion and fusion from c3 to 7. He is aware of risks  and benefits  Cameron Jackson M 02/28/2013, 4:48 PM

## 2013-03-01 ENCOUNTER — Encounter (HOSPITAL_COMMUNITY): Payer: Self-pay | Admitting: Family

## 2013-03-01 NOTE — Care Management Utilization Note (Signed)
Utilization review completed. Hernando Reali, RN BSN 

## 2013-03-01 NOTE — Op Note (Addendum)
Cameron Jackson, Cameron Jackson                 ACCOUNT NO.:  [id number]  MEDICAL RECORD NO.:  [id number]  LOCATION:  [id number]                        FACILITY:  [facility]  PHYSICIAN:  Hilda Lias, M.D.   DATE OF BIRTH:  Aug 10, 1950  DATE OF PROCEDURE:  02/28/2013 DATE OF DISCHARGE:                              OPERATIVE REPORT   PREOPERATIVE DIAGNOSIS:  Cervical stenosis, cervical 3-4, 4-5, 5-6, 6-7 with bilateral chronic and acute radiculopathy, left worse than right one.  POSTOPERATIVE DIAGNOSIS:  Cervical stenosis, cervical 3-4, 4-5, 5-6, 6-7 with bilateral chronic and acute radiculopathy, left worse than right one.  PROCEDURE:  Anterior 3-4, 4-5, 5-6, 6-7 diskectomy, decompression of the spinal cord, bilateral foraminotomy, interbody fusion with cages, plate, microscope, C-arm.  SURGEON:  Hilda Lias, M.D.  ASSISTANT:  Dr. Rolanda Jay.  CLINICAL HISTORY:  Cameron Jackson is a 62 year old gentleman complaining of neck pain __going to both arms________ for several years, which is getting worse slightly. The pain is going to the left side and then to the right side and he has a quite a bit of weakness of the biceps.  X-rays show severe stenosis at the level of 5-6, 6-7 with bilateral foraminal stenosis at the level of 3-4, 4-5.  We talked about conservative treatment.  We talked about surgery.  At the end, he agreed with surgery.  The risks were fully explained to him in my office.  DESCRIPTION OF PROCEDURE:  The patient was taken to the OR, and after intubation, the left side of the neck was cleaned with DuraPrep.  Drapes were applied.  Longitudinal incision through the skin and subcutaneous tissue was carried out all the way down to the cervical spine. Immediately, we found large anterior osteophyte and the first x-ray showed indeed the needle was at the level of C3-4.  Then, the osteophyte was removed.  We entered the disk space and diskectomy was achieved with decompression of the foramen  bilaterally.  The same procedure was done at the level of C4-5 with the same finding of foraminal stenosis, although there was some mild spinal stenosis.  At the level of 5-6, 6-7, the situation was worse.  The patient has calcification at the posterior ligament with thinning of the dura mater.  After the osteophyte were removed, we entered the disk space and at the level of 5-6 and we were able to go _down_________ to the posterior ligament with decompression of the spinal cord.  As I mentioned above, the patient had calcification of the ligament and decompression of the canal as well as the foramen was done.  At the level of C6-7, the situation__________ was different, although there was a space by looking at the x-ray nevertheless it was quite difficult to find the plane between C6-C7.  Cervical MRI, AP and lateral view was done and at the end, we found a faint demarcation between C6-7.  With the help of the microscope, we entered disk space, which was quite narrow.  We drilled all the way posterior until we found the calcified posterior ligament.  Decompression was achieved with foraminal decompression bilaterally.  At the end, the endplate of those 4 levels where removed and a cage of  6 mm height with autograft and DBX was inserted at c3-4 and the other 3 level the cage was 7 mm height. Then, a plate with the length to cover from c3 to c7 was used and a total of 10 screws were applied to keep the cages secured in place.  The area was irrigated. Hemostasis was achieved.  Because of the dissection, the patient had been on naproxen, we left a drain in the pre cervical__________ area.  Then, the wound was closed with Vicryl and Steri-Strips.          ______________________________ Hilda Lias, M.D.     EB/MEDQ  D:  02/28/2013  T:  03/01/2013  Job:  [id number]

## 2013-03-01 NOTE — Progress Notes (Signed)
Pt unable to void again. Bladder scanned , in and out cath obtain 1000 ml . Pt tolerated well.  Will continue monitor.

## 2013-03-01 NOTE — Progress Notes (Signed)
PT Cancellation Note  Patient Details Name: Cameron Jackson MRN: [id number] DOB: 1950-09-03   Cancelled Treatment:    Reason Eval/Treat Not Completed: OT screened, no needs identified, will sign off. No acute PT needs. Pt independent with ambulation.    Donnamarie Poag [place], [place] [phone] 03/01/2013, 9:56 AM

## 2013-03-01 NOTE — Progress Notes (Signed)
Patient ID: Cameron Jackson, male   DOB: 1950-05-01, 62 y.o.   MRN: [id number] Ambulating, voice normal. Some posterior shoulders pain. Drain out

## 2013-03-01 NOTE — Evaluation (Signed)
Occupational Therapy Evaluation Patient Details Name: Cameron Jackson MRN: [id number] DOB: Dec 23, 1950 Today's Date: 03/01/2013 Time: 9147-8295 OT Time Calculation (min): 22 min  OT Assessment / Plan / Recommendation History of present illness Anterior 3-4, 4-5, 5-6, 6-7 diskectomy, decompression of the spinal cord, bilateral foraminotomy, interbody fusion withcages, plate, microscope, C-arm.   Clinical Impression   This 62 yo male admitted with above presents to acute OT with all education completed, will sign off.    OT Assessment  Patient does not need any further OT services          Equipment Recommendations  None recommended by OT          Precautions / Restrictions Precautions Precautions: Cervical Precaution Booklet Issued: Yes (comment) Required Braces or Orthoses: Cervical Brace Cervical Brace: Soft collar;At all times Restrictions Weight Bearing Restrictions: No   Pertinent Vitals/Pain 3/10 neck; no intervention needed    ADL  Equipment Used:  (c-collar) Transfers/Ambulation Related to ADLs: Independent with all ADL Comments: Wife can A with LADLs if he has difficulty. Did explain to him that he could have collar off to eat, brush teeth, and shave but just be careful that he is not moving his neck too much when collar off       Acute Rehab OT Goals Patient Stated Goal: Home on saturday  Visit Information  Last OT Received On: 03/01/13 Assistance Needed: +1 History of Present Illness: Anterior 3-4, 4-5, 5-6, 6-7 diskectomy, decompression of the spinal cord, bilateral foraminotomy, interbody fusion withcages, plate, microscope, C-arm.       Prior Functioning     Home Living Family/patient expects to be discharged to:: Private residence Living Arrangements: Spouse/significant other Available Help at Discharge: Family;Available 24 hours/day Type of Home: House Home Access: Stairs to enter Home Layout: One level Home Equipment: None Prior  Function Level of Independence: Independent Communication Communication: No difficulties Dominant Hand: Right         Vision/Perception Vision - History Patient Visual Report: No change from baseline   Cognition  Cognition Arousal/Alertness: Awake/alert Behavior During Therapy: WFL for tasks assessed/performed Overall Cognitive Status: Within Functional Limits for tasks assessed    Extremity/Trunk Assessment Upper Extremity Assessment Upper Extremity Assessment: Overall WFL for tasks assessed     Mobility Bed Mobility Bed Mobility: Rolling Left;Left Sidelying to Sit Rolling Left: 6: Modified independent (Device/Increase time);With rail Left Sidelying to Sit: 6: Modified independent (Device/Increase time);With rails;HOB flat Transfers Transfers: Sit to Stand;Stand to Sit Sit to Stand: 7: Independent Stand to Sit: 7: Independent           End of Session OT - End of Session Equipment Utilized During Treatment: Cervical collar Activity Tolerance: Patient tolerated treatment well Patient left: in chair;with call bell/phone within reach;with family/visitor present    Evette Georges [phone] 03/01/2013, 10:57 AM

## 2013-03-01 NOTE — Progress Notes (Signed)
Orthopedic Tech Progress Note Patient Details:  Cameron Jackson [birth year] [id number]  Ortho Devices Type of Ortho Device: Soft collar   Cameron Jackson 03/01/2013, 12:23 AM

## 2013-03-01 NOTE — Progress Notes (Signed)
Patient ID: Cameron Jackson, male   DOB: 1951-01-30, 62 y.o.   MRN: [id number] Afeb, vss No new neuro issues Says pain is better than pre op. Wound fine, drain removed. Will increase activity. Plan d/c tonmorrow

## 2013-03-01 NOTE — Progress Notes (Signed)
Patient is wearing home CPAP due to sleep apnea.  Continuous pulse oximetry continued, but continuous ETCO2 monitor could not be continued due to patient's CPAP mask.  Current SpO2 is 96% and respirations 16 while wearing CPAP.  Respiratory contacted and the use of CPAP with PCA was discussed.  Patient will continue to be monitored with increased frequency.

## 2013-03-01 NOTE — Progress Notes (Addendum)
Pt had difficulty urinating bladder scan done showed 450-500cc In and out cath done .950cc obtained

## 2013-03-02 MED ORDER — OXYCODONE-ACETAMINOPHEN 5-325 MG PO TABS: 1.0000 | ORAL_TABLET | ORAL | Status: DC | PRN

## 2013-03-02 MED ORDER — DIAZEPAM 5 MG PO TABS: 5.0000 mg | ORAL_TABLET | Freq: Four times a day (QID) | ORAL | Status: DC | PRN

## 2013-03-02 MED ORDER — TAMSULOSIN HCL 0.4 MG PO CAPS: 0.8000 mg | ORAL_CAPSULE | Freq: Every day | ORAL | Status: DC

## 2013-03-02 NOTE — Progress Notes (Signed)
03/02/13 Per OT and OT evals, no need for home heath therapy and no equipment needs identified.Jacquelynn Cree RN, BSN, CCM

## 2013-03-02 NOTE — Progress Notes (Signed)
Patient discharged to home accompanied by wife. Discharge instructions and rx given and explained and patient stated understanding. IV was removed. Patient left unit in a stable condition via wheelchair.

## 2013-03-02 NOTE — Discharge Summary (Signed)
  Physician Discharge Summary  Patient ID: Cameron Jackson MRN: [id number] DOB/AGE: 1950/04/30 62 y.o.  Admit date: 02/28/2013 Discharge date: 03/02/2013  Admission Diagnoses:  Discharge Diagnoses:  Active Problems:   Cervical stenosis of spinal canal   Discharged Condition: good  Hospital Course: Surgery 2 days ago with 4 level acdf. Did well. Pain and numbness better. Wound fine. Home pod 2, specific instructions given.  Consults: None  Significant Diagnostic Studies: none  Treatments: surgery: C34 C45 C56 C67 acdf  Discharge Exam: Blood pressure 122/76, pulse 57, temperature 97.4 F (36.3 C), temperature source Oral, resp. rate 18, height 6\' 4"  (1.93 m), weight 120.067 kg (264 lb 11.2 oz), SpO2 98.00%. Incision/Wound:clean and dry  Disposition: Final discharge disposition not confirmed     Medication List    ASK your doctor about these medications       aspirin 81 MG tablet  Take 81 mg by mouth daily.     escitalopram 10 MG tablet  Commonly known as:  LEXAPRO  Take 10 mg by mouth daily.     HM VITAMIN D3 4000 UNITS Caps  Generic drug:  Cholecalciferol  Take 4,000 Units by mouth daily.     cholecalciferol 1000 UNITS tablet  Commonly known as:  VITAMIN D  Take 6,000 Units by mouth daily.     HYDROcodone-acetaminophen 5-325 MG per tablet  Commonly known as:  NORCO/VICODIN  Take 1 tablet by mouth every 6 (six) hours as needed for moderate pain.     levothyroxine 125 MCG tablet  Commonly known as:  SYNTHROID, LEVOTHROID  Take 125 mcg by mouth daily before breakfast.     naproxen 500 MG tablet  Commonly known as:  NAPROSYN  Take 500 mg by mouth 2 (two) times daily with a meal.     tamsulosin 0.4 MG Caps capsule  Commonly known as:  FLOMAX  Take 0.4 mg by mouth.     zolpidem 12.5 MG CR tablet  Commonly known as:  AMBIEN CR  Take 12.5 mg by mouth at bedtime.         At home rest most of the time. Get up 9 or 10 times each day and take a 15 or 20  minute walk. No riding in the car and to your first postoperative appointment. If you have neck surgery you may shower from the chest down starting on the third postoperative day. If you had back surgery he may start showering on the third postoperative day with saran wrap wrapped around your incisional area 3 times. After the shower remove the saran wrap. Take pain medicine as needed and other medications as instructed. Call my office for an appointment.  SignedReinaldo Meeker, MD 03/02/2013, 11:36 AM

## 2013-03-06 ENCOUNTER — Encounter (HOSPITAL_COMMUNITY): Payer: Self-pay | Admitting: Neurosurgery

## 2015-06-07 IMAGING — RF DG MYELOGRAM CERVICAL
12 of 13 series · 12 of 13 positions shown · non-contrast
Comparison: none

CLINICAL DATA: Left-sided neck pain with left upper extremity
radicular symptoms.
TECHNIQUE: Contiguous axial images were obtained through the Cervical spine
without infusion. Coronal and sagittal reconstructions were obtained
of the axial image sets.

[Series 2: (hospital) · 1 of 1 slices shown]
[im 1/1]
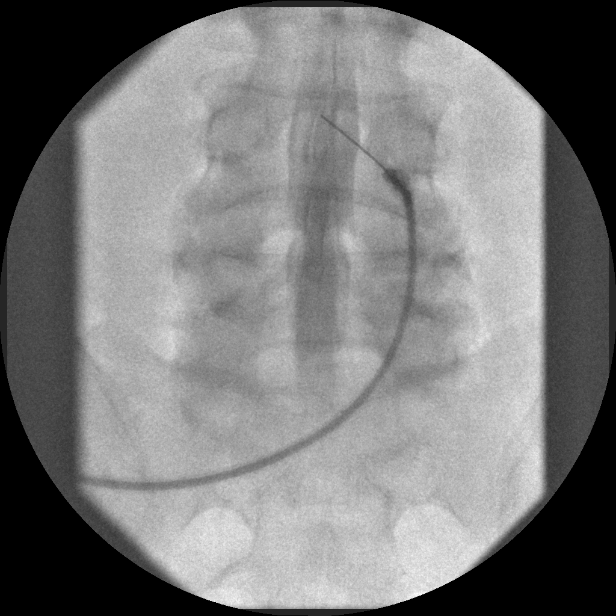

[Series 3: myelogram  white · 1 of 1 slices shown (1 of 8)]
[im 1/1]
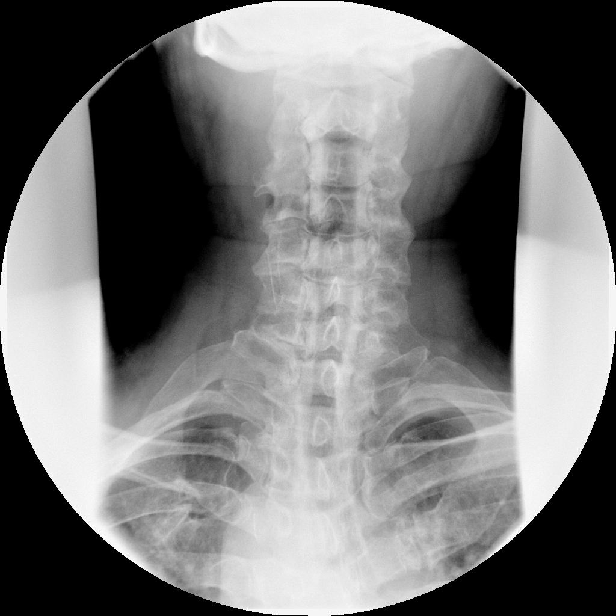

[Series 4: myelogram  white · 1 of 1 slices shown (2 of 8)]
[im 1/1]
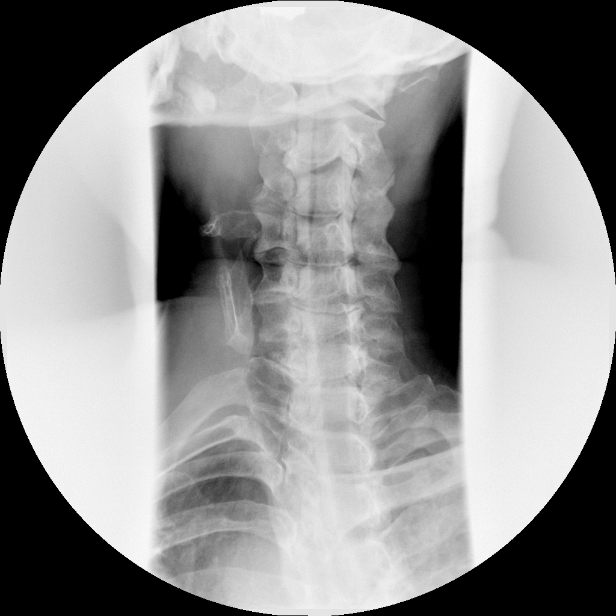

[Series 5: myelogram  white · 1 of 1 slices shown (3 of 8)]
[im 1/1]
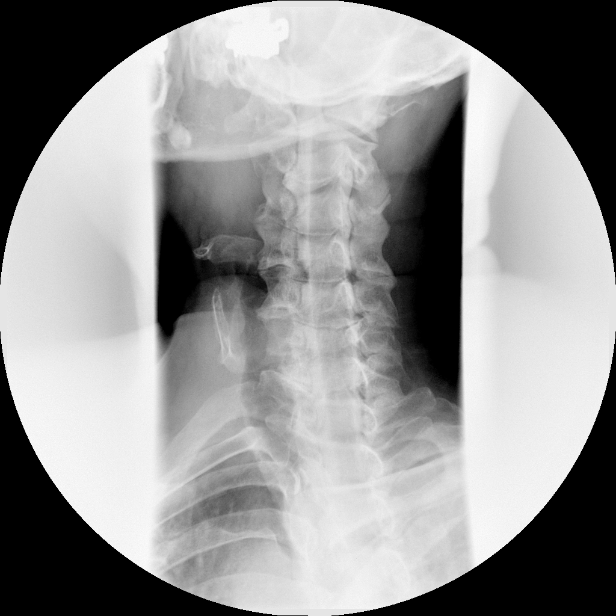

[Series 6: myelogram  white · 1 of 1 slices shown (4 of 8)]
[im 1/1]
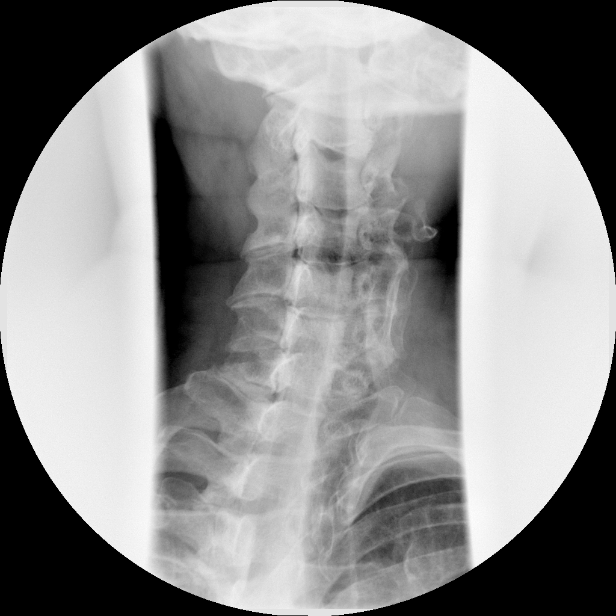

[Series 7: myelogram  white · 1 of 1 slices shown (5 of 8)]
[im 1/1]
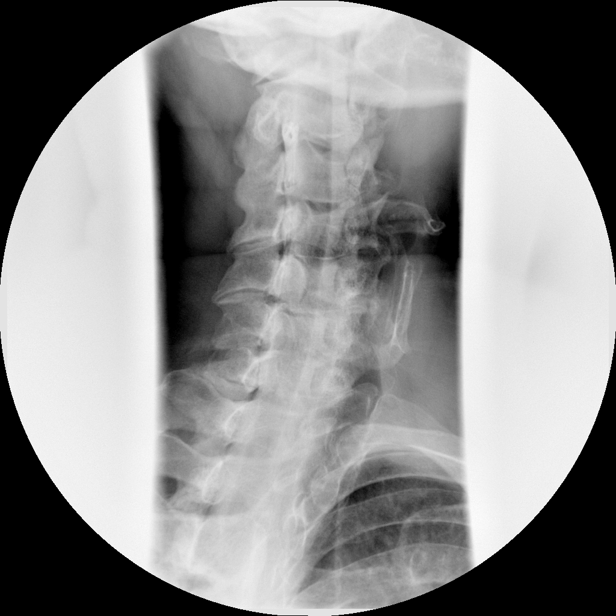

[Series 9: myelogram  white · 1 of 1 slices shown (6 of 8)]
[im 1/1]
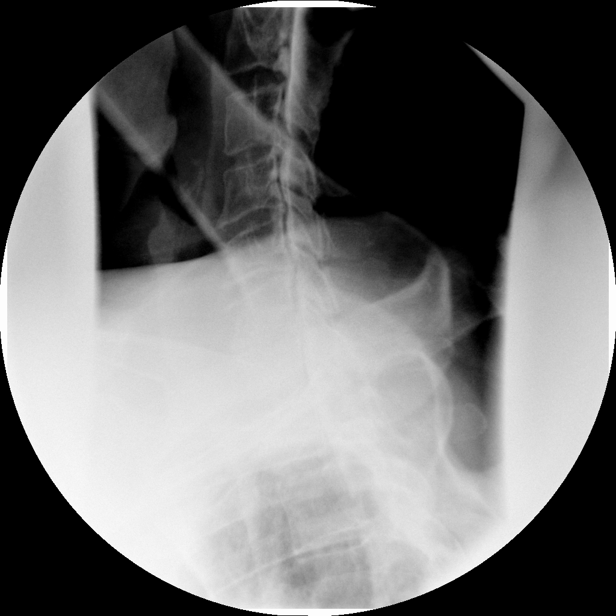

[Series 10: myelogram  white · 1 of 1 slices shown (7 of 8)]
[im 1/1]
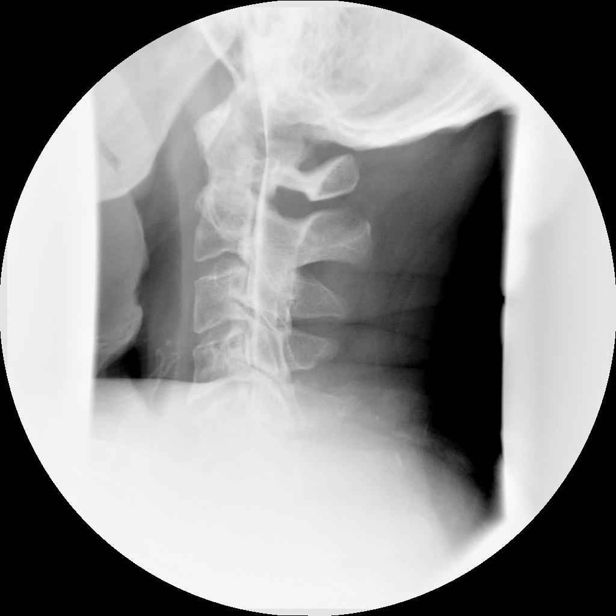

[Series 11: myelogram  white · 1 of 1 slices shown (8 of 8)]
[im 1/1]
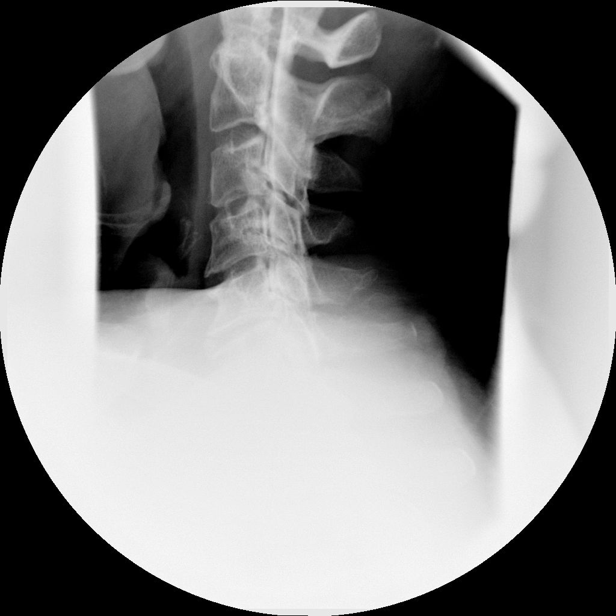

[Series 1001: view not recorded · 0.15mm/px · 1 of 1 slices shown (1 of 3)]
[im 1/1]
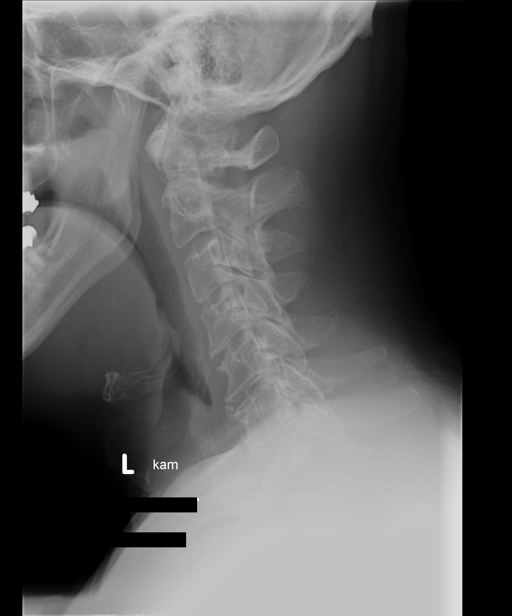

[Series 1002: view not recorded · 0.15mm/px · 1 of 1 slices shown (2 of 3)]
[im 1/1]
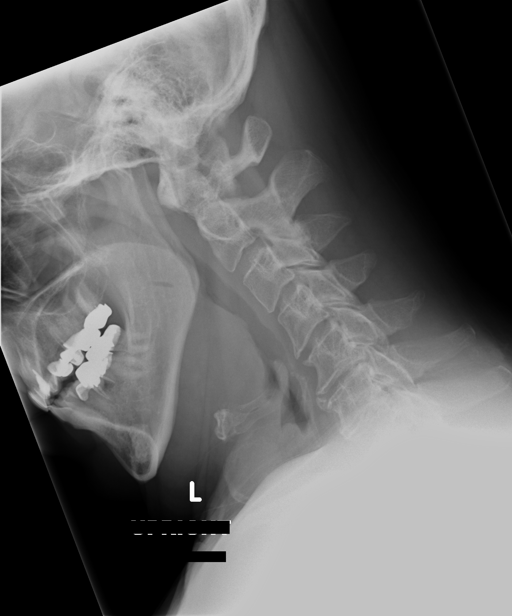

[Series 1003: view not recorded · 0.15mm/px · 1 of 1 slices shown (3 of 3)]
[im 1/1]
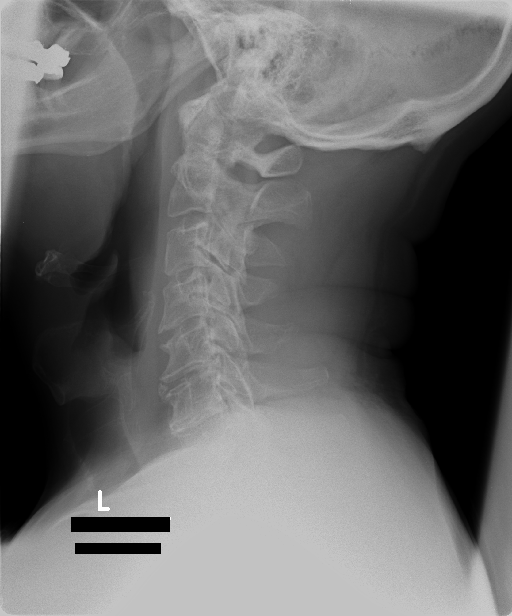

[12 of 13 positions shown; findings below may reference images not displayed]

FLUOROSCOPY TIME:  0 min 46 seconds

PROCEDURE:
LUMBAR PUNCTURE FOR CERVICAL MYELOGRAM

After thorough discussion of risks and benefits of the procedure
including bleeding, infection, injury to nerves, blood vessels,
adjacent structures as well as headache and CSF leak, written and
oral informed consent was obtained. Consent was obtained by Dr.
Maryjane Lalo. We discussed the high likelihood of obtaining a
diagnostic study.

Patient was positioned prone on the fluoroscopy table. Local
anesthesia was provided with 1% lidocaine without epinephrine after
prepped and draped in the usual sterile fashion. Puncture was
performed at L3-L4 using a 3 1/2 inch 22 gauge Janae Cummings point
via right paramedian approach. Using a single pass through the dura,
the needle was placed within the thecal sac, with return of clear
CSF. 8 mL of Nmnipaque-IPP was injected into the thecal sac, with
normal opacification of the nerve roots and cauda equina consistent
with free flow within the subarachnoid space. The patient was then
moved to the trendelenburg position and contrast flowed into the
Cervical spine region.

I personally performed the lumbar puncture and administered the
intrathecal contrast. I also personally supervised acquisition of
the myelogram images.
FINDINGS: CERVICAL MYELOGRAM FINDINGS:

Shallow multilevel disc osteophyte complexes are present at C3-C4,
C4-C5 and C5-C6 with anterior extradural IMPRESSION. In the neutral
position, there are 2 mm retrolisthesis of C3 on C4 which does not
change with extension and reduced cyst anatomic with flexion. 3 mm
of retrolisthesis of C5 on C6 does not change between flexion and
extension maneuvers. Flexion and extension range of motion is mildly
reduced. The prevertebral soft tissues are normal.

CT CERVICAL MYELOGRAM FINDINGS:

Dextro convex cervical torticollis is present which is associated
with a cervicothoracic scoliosis, partially visualized.
Craniocervical alignment appears within normal limits. Atlantodental
degenerative disease is present. Study is technically degraded in
the lower cervical spine due to artifact from shoulders and body
habitus. The lung apices appear within normal limits. Cervical cord
demonstrates normal caliber.

C2-C3: Mild disk degeneration is present. There is no stenosis.
Central canal and foramina are adequately patent.

C3-C4: Shallow broad-based slightly left eccentric disc osteophyte
complex with moderate adhesive superimposed central disc protrusion
is present. Central stenosis. Disk osteophyte complex contacts and
indents the ventral aspect of the cord. AP diameter of the central
canal is 7 mm. Short pedicle on the left along with uncovertebral
spurring and facet arthrosis produce left-greater-than-right
foraminal stenosis. Right foraminal stenosis is very mild.
Retrolisthesis with supine imaging measures between 1 mm and 2 mm.

C4-C5: Moderate central stenosis is present associated with disc
osteophyte complex. There is indentation of the ventral cord. There
is bilateral foraminal stenosis, greater on the left than right
associated with uncovertebral spurring and congenitally short
pedicles. This potentially affects both C5 nerves. The central
stenosis is slightly less pronounced than at the level above. Mild
facet arthrosis.

C5-C6: Severe central stenosis is present associated with right
eccentric disc osteophyte complex. There is flattening of the
cervical cord. There is also ossification of the posterior
longitudinal ligament just to the right of midline contributing to
the central stenosis. Bilateral foraminal stenosis due to facet
arthrosis and uncovertebral spurring. The foraminal stenosis is
greater on the right than left but potentially affects both C6
nerves.

C6-C7: Severe disk degeneration is present with calcification of the
disc and ossification of the disc. On the coronal images, there
appears to be ankylosis across the disk space. There is a
broad-based posterior osteophyte that produces mild central
stenosis, contacting the ventral cord. Left-greater-than-right
bilateral foraminal stenosis associated with uncovertebral spurring
potentially affects the C7 nerves although in the setting of
ankylosis, the significance is unclear.

C7-T1: This level is technically degraded. In conjunction with the
MRI, the central canal is probably adequately patent. The foramina
also appear patent. There is bilateral moderate facet arthrosis and
mild left foraminal encroachment associated with facet spurring.
IMPRESSION: 1. Technically successful lumbar puncture for cervical myelogram.
2. Dextro convex torticollis appear is associated with
cervicothoracic scoliosis.
3. Severe C6-C7 degenerative disc disease with ankylosis across the
disk space. Bilateral foraminal stenosis associated with
uncovertebral spurring of unclear significance in the setting of
ankylosis.
4. C5-C6 severe central stenosis with right greater than left
foraminal stenosis.
5. C3-C4 and C4-C5 moderate central stenosis and foraminal stenosis
detailed above.

## 2017-10-03 DIAGNOSIS — G4733 Obstructive sleep apnea (adult) (pediatric): Secondary | ICD-10-CM | POA: Diagnosis not present

## 2017-10-08 DIAGNOSIS — H8110 Benign paroxysmal vertigo, unspecified ear: Secondary | ICD-10-CM | POA: Diagnosis not present

## 2017-10-08 DIAGNOSIS — F17210 Nicotine dependence, cigarettes, uncomplicated: Secondary | ICD-10-CM | POA: Diagnosis not present

## 2017-10-08 DIAGNOSIS — I639 Cerebral infarction, unspecified: Secondary | ICD-10-CM | POA: Diagnosis not present

## 2017-10-08 DIAGNOSIS — F10920 Alcohol use, unspecified with intoxication, uncomplicated: Secondary | ICD-10-CM | POA: Diagnosis not present

## 2017-10-08 DIAGNOSIS — R29700 NIHSS score 0: Secondary | ICD-10-CM | POA: Diagnosis not present

## 2017-10-08 DIAGNOSIS — Z79899 Other long term (current) drug therapy: Secondary | ICD-10-CM | POA: Diagnosis not present

## 2017-10-08 DIAGNOSIS — R42 Dizziness and giddiness: Secondary | ICD-10-CM | POA: Diagnosis not present

## 2017-10-08 DIAGNOSIS — R001 Bradycardia, unspecified: Secondary | ICD-10-CM | POA: Diagnosis not present

## 2017-10-08 DIAGNOSIS — E7800 Pure hypercholesterolemia, unspecified: Secondary | ICD-10-CM | POA: Diagnosis not present

## 2017-10-08 DIAGNOSIS — E039 Hypothyroidism, unspecified: Secondary | ICD-10-CM | POA: Diagnosis not present

## 2017-10-08 DIAGNOSIS — R4781 Slurred speech: Secondary | ICD-10-CM | POA: Diagnosis not present

## 2017-10-08 DIAGNOSIS — Z7982 Long term (current) use of aspirin: Secondary | ICD-10-CM | POA: Diagnosis not present

## 2017-10-08 DIAGNOSIS — F10929 Alcohol use, unspecified with intoxication, unspecified: Secondary | ICD-10-CM | POA: Diagnosis not present

## 2017-10-09 DIAGNOSIS — E039 Hypothyroidism, unspecified: Secondary | ICD-10-CM | POA: Diagnosis not present

## 2017-10-09 DIAGNOSIS — R42 Dizziness and giddiness: Secondary | ICD-10-CM | POA: Diagnosis not present

## 2017-10-09 DIAGNOSIS — R4781 Slurred speech: Secondary | ICD-10-CM | POA: Diagnosis not present

## 2017-10-09 DIAGNOSIS — R001 Bradycardia, unspecified: Secondary | ICD-10-CM | POA: Diagnosis not present

## 2017-10-09 DIAGNOSIS — I639 Cerebral infarction, unspecified: Secondary | ICD-10-CM | POA: Diagnosis not present

## 2017-10-10 DIAGNOSIS — E039 Hypothyroidism, unspecified: Secondary | ICD-10-CM | POA: Diagnosis not present

## 2017-10-10 DIAGNOSIS — R001 Bradycardia, unspecified: Secondary | ICD-10-CM | POA: Diagnosis not present

## 2017-10-10 DIAGNOSIS — I639 Cerebral infarction, unspecified: Secondary | ICD-10-CM | POA: Diagnosis not present

## 2017-10-10 DIAGNOSIS — R42 Dizziness and giddiness: Secondary | ICD-10-CM | POA: Diagnosis not present

## 2017-10-13 ENCOUNTER — Other Ambulatory Visit: Payer: Self-pay

## 2017-10-13 NOTE — Patient Outreach (Signed)
[facility] Care Management  10/13/2017  Cameron Jackson Feb 27, 1951 [id number]  Transition of care  Referral date: 10/13/17 Referral source[facility] Insurance: [employer] team advantage Attempt #1  Telephone call to patient regarding transition of care referral. Unable to reach. HIPAA compliant voice message left with call back phone number.   PLAN:  RNCM will attempt 2nd telephone call to patient within 4 business days.  RNCM will send patient outreach letter to attempt contact.  Cameron InaDavina Yarlin Breisch RN,BSN,CCM [facility] Telephonic  [phone]

## 2017-10-13 NOTE — Progress Notes (Signed)
This encounter was created in error - please disregard.

## 2017-10-13 NOTE — Addendum Note (Signed)
Addended by: Sharalyn InkOMER, Dennette Faulconer M on: 10/13/2017 09:33 AM   Modules accepted: Level of Service, SmartSet

## 2017-10-14 DIAGNOSIS — M25511 Pain in right shoulder: Secondary | ICD-10-CM | POA: Diagnosis not present

## 2017-10-14 DIAGNOSIS — M25612 Stiffness of left shoulder, not elsewhere classified: Secondary | ICD-10-CM | POA: Diagnosis not present

## 2017-10-14 DIAGNOSIS — M25512 Pain in left shoulder: Secondary | ICD-10-CM | POA: Diagnosis not present

## 2017-10-14 DIAGNOSIS — H81399 Other peripheral vertigo, unspecified ear: Secondary | ICD-10-CM | POA: Diagnosis not present

## 2017-10-14 DIAGNOSIS — R293 Abnormal posture: Secondary | ICD-10-CM | POA: Diagnosis not present

## 2017-10-14 DIAGNOSIS — R2689 Other abnormalities of gait and mobility: Secondary | ICD-10-CM | POA: Diagnosis not present

## 2017-10-14 DIAGNOSIS — M6281 Muscle weakness (generalized): Secondary | ICD-10-CM | POA: Diagnosis not present

## 2017-10-14 DIAGNOSIS — M2560 Stiffness of unspecified joint, not elsewhere classified: Secondary | ICD-10-CM | POA: Diagnosis not present

## 2017-10-14 DIAGNOSIS — M25611 Stiffness of right shoulder, not elsewhere classified: Secondary | ICD-10-CM | POA: Diagnosis not present

## 2017-10-14 DIAGNOSIS — I6930 Unspecified sequelae of cerebral infarction: Secondary | ICD-10-CM | POA: Diagnosis not present

## 2017-10-14 DIAGNOSIS — R2681 Unsteadiness on feet: Secondary | ICD-10-CM | POA: Diagnosis not present

## 2017-10-18 ENCOUNTER — Other Ambulatory Visit: Payer: Self-pay

## 2017-10-18 ENCOUNTER — Ambulatory Visit: Payer: Self-pay

## 2017-10-18 NOTE — Patient Outreach (Signed)
[facility] Care Management  10/18/2017  Cameron Jackson 08-Dec-1950 [id number]   Transition of care  Referral date: 10/13/17 Referral source: [facility] Insurance: Health team advantage   Telephone call to patient regarding transition of care referral. HIPAA verified with patient. Explained reason for call. Patient states he was in the hospital due to having a stroke.   Patient reports he has a new primary MD and is scheduled for an appointment on 11/01/17.  Patient states he is going to outpatient rehab to assist with his balance. Denies having to use an ambulatory device.  Denies any additional symptoms. Patient states he has transportation to his appointments. Patient reports he has all of his medications and takes them as prescribed.  RNCM discussed and offered ongoing transition of care follow up with patient. Patient declined services. He states overall he feels he is doing well.   RNCM reviewed signs/ symptoms of stroke.Advised patient that 911 should be called for stroke like symptoms. Advised patient to contact his doctor for any non emergent symptoms. Patient verbalized understanding.  [facility]ed to [facility] name and contact information with 24 hours nurse call line number on patients voice mail as requested.  Patient's voice mail was full.  Will mail information to patient.   PLAN: RNCM will close patient due to patient refusing services.  RNCM will send patient  [facility] brochure/ magnet RNCM will send primary MD closure notification  [name] RN,BSN,CCM [facility] Telephonic  [phone]

## 2017-11-02 DIAGNOSIS — I639 Cerebral infarction, unspecified: Secondary | ICD-10-CM | POA: Diagnosis not present

## 2017-11-02 DIAGNOSIS — E039 Hypothyroidism, unspecified: Secondary | ICD-10-CM | POA: Diagnosis not present

## 2017-11-02 DIAGNOSIS — F329 Major depressive disorder, single episode, unspecified: Secondary | ICD-10-CM | POA: Diagnosis not present

## 2017-11-02 DIAGNOSIS — F5101 Primary insomnia: Secondary | ICD-10-CM | POA: Diagnosis not present

## 2017-11-03 DIAGNOSIS — G4733 Obstructive sleep apnea (adult) (pediatric): Secondary | ICD-10-CM | POA: Diagnosis not present

## 2017-11-04 DIAGNOSIS — M2560 Stiffness of unspecified joint, not elsewhere classified: Secondary | ICD-10-CM | POA: Diagnosis not present

## 2017-11-04 DIAGNOSIS — H81399 Other peripheral vertigo, unspecified ear: Secondary | ICD-10-CM | POA: Diagnosis not present

## 2017-11-04 DIAGNOSIS — M25511 Pain in right shoulder: Secondary | ICD-10-CM | POA: Diagnosis not present

## 2017-11-04 DIAGNOSIS — R293 Abnormal posture: Secondary | ICD-10-CM | POA: Diagnosis not present

## 2017-11-04 DIAGNOSIS — M6281 Muscle weakness (generalized): Secondary | ICD-10-CM | POA: Diagnosis not present

## 2017-11-04 DIAGNOSIS — I6930 Unspecified sequelae of cerebral infarction: Secondary | ICD-10-CM | POA: Diagnosis not present

## 2017-11-04 DIAGNOSIS — M25512 Pain in left shoulder: Secondary | ICD-10-CM | POA: Diagnosis not present

## 2017-11-04 DIAGNOSIS — R2689 Other abnormalities of gait and mobility: Secondary | ICD-10-CM | POA: Diagnosis not present

## 2017-11-04 DIAGNOSIS — R2681 Unsteadiness on feet: Secondary | ICD-10-CM | POA: Diagnosis not present

## 2017-11-04 DIAGNOSIS — M25611 Stiffness of right shoulder, not elsewhere classified: Secondary | ICD-10-CM | POA: Diagnosis not present

## 2017-11-04 DIAGNOSIS — M25612 Stiffness of left shoulder, not elsewhere classified: Secondary | ICD-10-CM | POA: Diagnosis not present

## 2017-11-11 ENCOUNTER — Telehealth: Payer: Self-pay | Admitting: Neurology

## 2017-11-11 ENCOUNTER — Ambulatory Visit (INDEPENDENT_AMBULATORY_CARE_PROVIDER_SITE_OTHER): Payer: PPO | Admitting: Neurology

## 2017-11-11 ENCOUNTER — Encounter: Payer: Self-pay | Admitting: Neurology

## 2017-11-11 VITALS — BP 100/60 | HR 56 | Ht 76.0 in | Wt 262.0 lb

## 2017-11-11 DIAGNOSIS — I639 Cerebral infarction, unspecified: Secondary | ICD-10-CM | POA: Diagnosis not present

## 2017-11-11 MED ORDER — ALPRAZOLAM 0.5 MG PO TABS: ORAL_TABLET | ORAL | 0 refills | Status: DC

## 2017-11-11 MED ORDER — ASPIRIN 325 MG PO TABS: 325.0000 mg | ORAL_TABLET | Freq: Every day | ORAL | Status: DC

## 2017-11-11 NOTE — Progress Notes (Signed)
Reason for visit: Stroke  Referring physician: Dr. Luan MooreHeyn  Jaymie Lucienne CapersLee Krahenbuhl is a 67 y.o. male  History of present illness:  Mr. Manson PasseyBrown is a 67 year old left-handed white male with a history of hypertension and dyslipidemia.  The patient was admitted to the hospital at [facility] on 09 October 2017.  The patient had onset of dizziness associated with vertigo but no nausea or vomiting.  He also had some slurring of speech and some gait instability with a tendency to go to the left.  The patient underwent a CT scan of the brain, he was told he could not have MRI of the brain because of prior cervical spine surgery.  The patient has recovered well from this, he essentially has no deficits.  He does report some shoulder stiffening and discomfort bilaterally that he has noted improvement with associated with physical therapy.  The patient has no numbness or weakness of the extremities, he denies any difficulty controlling the bowels or the bladder.  He has not had any headaches or vision changes.  He has not had any problems with swallowing.  He did have a headache 2 weeks before the onset of the above deficit.  The patient was told that he may have had a right basal ganglia stroke.  CT angiogram was done of the head and neck and was unremarkable, apparently a 2D echocardiogram was done, the results of this are not available to me.  He apparently had some decreased heart rates into the 38 range at night while sleeping.  The patient was on 81 mg of aspirin prior to the onset of the deficit.  He was placed on cholesterol medications during the hospitalization.  He is sent to this office for an evaluation.  Past Medical History:  Diagnosis Date  . Arthritis    Osteo Arthritis  . Complication of anesthesia    hypothermia- 20 years ago.  Aggessive behavior - with `endo' years ago.  Marland Kitchen. Hyperlipidemia   . Hypertension    not on medication now. 140/80s is high for patient.  . Hypothyroidism   . Mental  disorder   . Sleep apnea    CPAP  . Stroke [facility](H[facility])    "mini" stroke per MRI    Past Surgical History:  Procedure Laterality Date  . ANTERIOR CERVICAL DECOMPRESSION/DISCECTOMY FUSION 4 LEVELS N/A 02/28/2013   Procedure: ANTERIOR CERVICAL DECOMPRESSION/DISCECTOMY FUSION 4 LEVELS;  Surgeon: Karn CassisErnesto M Botero, MD;  Location: [facility];  Service: Neurosurgery;  Laterality: N/A;  C3-4 C4-5 C5-6 C6-7 Anterior cervical decompression/diskectomy/fusion  . COLONOSCOPY    . KNEE SURGERY  [phone],1982,1991   left  . TONSILLECTOMY      Family History  Problem Relation Age of Onset  . Allergies Father   . Heart disease Father   . Allergies Brother   . Heart disease Mother     Social history:  reports that he quit smoking about 36 years ago. His smoking use included cigarettes. He has a 22.00 pack-year smoking history. He quit smokeless tobacco use about 13 years ago.  His smokeless tobacco use included chew. He reports that he does not drink alcohol or use drugs.  Medications:  Prior to Admission medications   Medication Sig Start Date End Date Taking? Authorizing Provider  aspirin 81 MG tablet Take 81 mg by mouth daily.   Yes [provider]  atorvastatin (LIPITOR) 40 MG tablet Take 40 mg by mouth daily.   Yes [provider]  cholecalciferol (VITAMIN  D) 1000 UNITS tablet Take 6,000 Units by mouth daily.   Yes [provider]  escitalopram (LEXAPRO) 10 MG tablet Take 10 mg by mouth 3 (three) times daily.    Yes [provider]  levothyroxine (SYNTHROID, LEVOTHROID) 125 MCG tablet Take 125 mcg by mouth daily before breakfast.   Yes [provider]  tamsulosin (FLOMAX) 0.4 MG CAPS capsule Take 0.4 mg by mouth daily.    Yes [provider]  zolpidem (AMBIEN CR) 6.25 MG CR tablet Take 6.25 mg by mouth at bedtime as needed for sleep.   Yes [provider]      Allergies  Allergen Reactions  . Other Swelling    Bee Stings- swelling of  face.    ROS:  Out of a complete 14 system review of symptoms, the patient complains only of the following symptoms, and all other reviewed systems are negative.  Fatigue Ringing in the ears Joint pain Weakness Depression Insomnia  Blood pressure 100/60, pulse (!) 56, height 6\' 4"  (1.93 m), weight 262 lb (118.8 kg).  Physical Exam  General: The patient is alert and cooperative at the time of the examination.  Eyes: Pupils are equal, round, and reactive to light. Discs are flat bilaterally.  Neck: The neck is supple, no carotid bruits are noted.  Respiratory: The respiratory examination is clear.  Cardiovascular: The cardiovascular examination reveals a regular rate and rhythm, no obvious murmurs or rubs are noted.  Skin: Extremities are without significant edema.  Neurologic Exam  Mental status: The patient is alert and oriented x 3 at the time of the examination. The patient has apparent normal recent and remote memory, with an apparently normal attention span and concentration ability.  Cranial nerves: Facial symmetry is present. There is good sensation of the face to pinprick and soft touch bilaterally. The strength of the facial muscles and the muscles to head turning and shoulder shrug are normal bilaterally. Speech is well enunciated, no aphasia or dysarthria is noted. Extraocular movements are full. Visual fields are full. The tongue is midline, and the patient has symmetric elevation of the soft palate. No obvious hearing deficits are noted.  Motor: The motor testing reveals 5 over 5 strength of all 4 extremities. Good symmetric motor tone is noted throughout.  Sensory: Sensory testing is intact to pinprick, soft touch, vibration sensation, and position sense on all 4 extremities. No evidence of extinction is noted.  Coordination: Cerebellar testing reveals good finger-nose-finger and heel-to-shin bilaterally.  Gait and station: Gait is normal. Tandem gait is normal.  Romberg is negative. No drift is seen.  Reflexes: Deep tendon reflexes are symmetric and normal bilaterally, with exception some depression of ankle jerk reflexes bilaterally. Toes are downgoing bilaterally.   Assessment/Plan:  1.  Recent stroke event  The patient will be sent for MRI of the brain, we will obtain the discharge summary report from [facility].  The patient will be increased on aspirin taking 325 mg daily.  I will contact patient when I have the results from the above evaluations.  The patient appears to have no residual deficits from the original event.  Marlan Palau MD 11/11/2017 10:16 AM  [facility] [address] [place], [place] [address]  Phone [phone] Fax [phone]

## 2017-11-11 NOTE — Telephone Encounter (Signed)
healthe team order sent to GI no auth they will reach out to the pt to schedule.

## 2017-11-20 ENCOUNTER — Ambulatory Visit
Admission: RE | Admit: 2017-11-20 | Discharge: 2017-11-20 | Disposition: A | Discharge status: Home / Self Care | Payer: PPO | Source: Ambulatory Visit | Attending: Neurology | Admitting: Neurology

## 2017-11-20 DIAGNOSIS — I639 Cerebral infarction, unspecified: Secondary | ICD-10-CM

## 2017-11-21 ENCOUNTER — Telehealth: Payer: Self-pay | Admitting: Neurology

## 2017-11-21 NOTE — Telephone Encounter (Signed)
  I called the patient.  MRI of the brain does not show a stroke in the right basal ganglia, evidence of a [employer] Robin space.  I discussed this with the patient.  He is having bilateral shoulder pain, this appears to be mechanical in nature, may require an orthopedic evaluation.  MRI brain 11/21/17:  IMPRESSION: This MRI of the brain without contrast shows the following: 1.    There are some scattered T2/FLAIR hyperintense foci in the subcortical deep white matter of the hemispheres consistent with mild age-appropriate chronic microvascular ischemic changes.  None of these appear to be acute. 2.    The right basal ganglia focus noted on CT scan likely represents an expanded Virchow-Robin space and not a focus of chronic ischemia.   3.    There are no acute findings.

## 2017-11-22 DIAGNOSIS — G4733 Obstructive sleep apnea (adult) (pediatric): Secondary | ICD-10-CM | POA: Diagnosis not present

## 2017-11-22 DIAGNOSIS — J310 Chronic rhinitis: Secondary | ICD-10-CM | POA: Diagnosis not present

## 2017-12-06 DIAGNOSIS — M7541 Impingement syndrome of right shoulder: Secondary | ICD-10-CM | POA: Diagnosis not present

## 2017-12-06 DIAGNOSIS — M7542 Impingement syndrome of left shoulder: Secondary | ICD-10-CM | POA: Diagnosis not present

## 2017-12-13 DIAGNOSIS — M25512 Pain in left shoulder: Secondary | ICD-10-CM | POA: Diagnosis not present

## 2017-12-13 DIAGNOSIS — I6930 Unspecified sequelae of cerebral infarction: Secondary | ICD-10-CM | POA: Diagnosis not present

## 2017-12-13 DIAGNOSIS — M2560 Stiffness of unspecified joint, not elsewhere classified: Secondary | ICD-10-CM | POA: Diagnosis not present

## 2017-12-13 DIAGNOSIS — M25612 Stiffness of left shoulder, not elsewhere classified: Secondary | ICD-10-CM | POA: Diagnosis not present

## 2017-12-13 DIAGNOSIS — R2689 Other abnormalities of gait and mobility: Secondary | ICD-10-CM | POA: Diagnosis not present

## 2017-12-13 DIAGNOSIS — M25511 Pain in right shoulder: Secondary | ICD-10-CM | POA: Diagnosis not present

## 2017-12-13 DIAGNOSIS — R293 Abnormal posture: Secondary | ICD-10-CM | POA: Diagnosis not present

## 2017-12-13 DIAGNOSIS — M6281 Muscle weakness (generalized): Secondary | ICD-10-CM | POA: Diagnosis not present

## 2017-12-13 DIAGNOSIS — R2681 Unsteadiness on feet: Secondary | ICD-10-CM | POA: Diagnosis not present

## 2017-12-13 DIAGNOSIS — M25611 Stiffness of right shoulder, not elsewhere classified: Secondary | ICD-10-CM | POA: Diagnosis not present

## 2017-12-13 DIAGNOSIS — H81399 Other peripheral vertigo, unspecified ear: Secondary | ICD-10-CM | POA: Diagnosis not present

## 2017-12-16 DIAGNOSIS — M1712 Unilateral primary osteoarthritis, left knee: Secondary | ICD-10-CM | POA: Diagnosis not present

## 2017-12-23 DIAGNOSIS — M1712 Unilateral primary osteoarthritis, left knee: Secondary | ICD-10-CM | POA: Diagnosis not present

## 2017-12-30 DIAGNOSIS — M1712 Unilateral primary osteoarthritis, left knee: Secondary | ICD-10-CM | POA: Diagnosis not present

## 2018-01-03 DIAGNOSIS — R293 Abnormal posture: Secondary | ICD-10-CM | POA: Diagnosis not present

## 2018-01-03 DIAGNOSIS — M25512 Pain in left shoulder: Secondary | ICD-10-CM | POA: Diagnosis not present

## 2018-01-03 DIAGNOSIS — H81399 Other peripheral vertigo, unspecified ear: Secondary | ICD-10-CM | POA: Diagnosis not present

## 2018-01-03 DIAGNOSIS — M2560 Stiffness of unspecified joint, not elsewhere classified: Secondary | ICD-10-CM | POA: Diagnosis not present

## 2018-01-03 DIAGNOSIS — R2681 Unsteadiness on feet: Secondary | ICD-10-CM | POA: Diagnosis not present

## 2018-01-03 DIAGNOSIS — M25612 Stiffness of left shoulder, not elsewhere classified: Secondary | ICD-10-CM | POA: Diagnosis not present

## 2018-01-03 DIAGNOSIS — M25511 Pain in right shoulder: Secondary | ICD-10-CM | POA: Diagnosis not present

## 2018-01-03 DIAGNOSIS — M25611 Stiffness of right shoulder, not elsewhere classified: Secondary | ICD-10-CM | POA: Diagnosis not present

## 2018-01-03 DIAGNOSIS — R2689 Other abnormalities of gait and mobility: Secondary | ICD-10-CM | POA: Diagnosis not present

## 2018-01-03 DIAGNOSIS — I6930 Unspecified sequelae of cerebral infarction: Secondary | ICD-10-CM | POA: Diagnosis not present

## 2018-01-03 DIAGNOSIS — M6281 Muscle weakness (generalized): Secondary | ICD-10-CM | POA: Diagnosis not present

## 2018-01-18 DIAGNOSIS — M25512 Pain in left shoulder: Secondary | ICD-10-CM | POA: Diagnosis not present

## 2018-01-27 DIAGNOSIS — M25512 Pain in left shoulder: Secondary | ICD-10-CM | POA: Diagnosis not present

## 2018-02-03 DIAGNOSIS — M7542 Impingement syndrome of left shoulder: Secondary | ICD-10-CM | POA: Diagnosis not present

## 2018-02-03 DIAGNOSIS — M25512 Pain in left shoulder: Secondary | ICD-10-CM | POA: Diagnosis not present

## 2018-02-16 DIAGNOSIS — G4733 Obstructive sleep apnea (adult) (pediatric): Secondary | ICD-10-CM | POA: Diagnosis not present

## 2018-02-17 DIAGNOSIS — M1712 Unilateral primary osteoarthritis, left knee: Secondary | ICD-10-CM | POA: Diagnosis not present

## 2018-03-08 ENCOUNTER — Encounter: Payer: Self-pay | Admitting: Neurology

## 2018-03-08 ENCOUNTER — Telehealth: Payer: Self-pay | Admitting: Neurology

## 2018-03-08 NOTE — Telephone Encounter (Signed)
I called Cameron Jackson, the patient was seen and evaluated for a possible stroke event, the patient went into the hospital on 09 October 2017 with dizziness.  They questions a stroke event by CT scan but a subsequent MRI showed only an enlarged virtual Robin space, no evidence of a stroke episode.  The patient is cleared from a neurologic standpoint for shoulder surgery.

## 2018-03-08 NOTE — Telephone Encounter (Signed)
PA French Anaracy for Dr Rennis ChrisSupple @ [facility] has called asking that Dr Anne HahnWillis either call or fax clearance for shoulder surgery on 03-10-2018 French Anaracy has provided her cell# for Dr Anne HahnWillis to call [phone] or states he can hand write a note and fax to [phone]

## 2018-03-10 DIAGNOSIS — M66812 Spontaneous rupture of other tendons, left shoulder: Secondary | ICD-10-CM | POA: Diagnosis not present

## 2018-03-10 DIAGNOSIS — S43432A Superior glenoid labrum lesion of left shoulder, initial encounter: Secondary | ICD-10-CM | POA: Diagnosis not present

## 2018-03-10 DIAGNOSIS — M75112 Incomplete rotator cuff tear or rupture of left shoulder, not specified as traumatic: Secondary | ICD-10-CM | POA: Diagnosis not present

## 2018-03-10 DIAGNOSIS — Y999 Unspecified external cause status: Secondary | ICD-10-CM | POA: Diagnosis not present

## 2018-03-10 DIAGNOSIS — M19012 Primary osteoarthritis, left shoulder: Secondary | ICD-10-CM | POA: Diagnosis not present

## 2018-03-10 DIAGNOSIS — X58XXXA Exposure to other specified factors, initial encounter: Secondary | ICD-10-CM | POA: Diagnosis not present

## 2018-03-10 DIAGNOSIS — G8918 Other acute postprocedural pain: Secondary | ICD-10-CM | POA: Diagnosis not present

## 2018-03-10 DIAGNOSIS — S43492D Other sprain of left shoulder joint, subsequent encounter: Secondary | ICD-10-CM | POA: Diagnosis not present

## 2018-03-10 DIAGNOSIS — M7542 Impingement syndrome of left shoulder: Secondary | ICD-10-CM | POA: Diagnosis not present

## 2018-03-20 DIAGNOSIS — G4733 Obstructive sleep apnea (adult) (pediatric): Secondary | ICD-10-CM | POA: Diagnosis not present

## 2018-03-21 DIAGNOSIS — J310 Chronic rhinitis: Secondary | ICD-10-CM | POA: Diagnosis not present

## 2018-03-21 DIAGNOSIS — G4700 Insomnia, unspecified: Secondary | ICD-10-CM | POA: Diagnosis not present

## 2018-03-21 DIAGNOSIS — G4733 Obstructive sleep apnea (adult) (pediatric): Secondary | ICD-10-CM | POA: Diagnosis not present

## 2018-03-23 DIAGNOSIS — M25511 Pain in right shoulder: Secondary | ICD-10-CM | POA: Diagnosis not present

## 2018-03-23 DIAGNOSIS — Z4789 Encounter for other orthopedic aftercare: Secondary | ICD-10-CM | POA: Diagnosis not present

## 2018-03-23 DIAGNOSIS — M6281 Muscle weakness (generalized): Secondary | ICD-10-CM | POA: Diagnosis not present

## 2018-03-23 DIAGNOSIS — R293 Abnormal posture: Secondary | ICD-10-CM | POA: Diagnosis not present

## 2018-03-23 DIAGNOSIS — M25512 Pain in left shoulder: Secondary | ICD-10-CM | POA: Diagnosis not present

## 2018-03-23 DIAGNOSIS — M25611 Stiffness of right shoulder, not elsewhere classified: Secondary | ICD-10-CM | POA: Diagnosis not present

## 2018-03-23 DIAGNOSIS — M25622 Stiffness of left elbow, not elsewhere classified: Secondary | ICD-10-CM | POA: Diagnosis not present

## 2018-03-23 DIAGNOSIS — M25612 Stiffness of left shoulder, not elsewhere classified: Secondary | ICD-10-CM | POA: Diagnosis not present

## 2018-04-07 DIAGNOSIS — M25511 Pain in right shoulder: Secondary | ICD-10-CM | POA: Diagnosis not present

## 2018-04-07 DIAGNOSIS — M25611 Stiffness of right shoulder, not elsewhere classified: Secondary | ICD-10-CM | POA: Diagnosis not present

## 2018-04-07 DIAGNOSIS — M25512 Pain in left shoulder: Secondary | ICD-10-CM | POA: Diagnosis not present

## 2018-04-07 DIAGNOSIS — R293 Abnormal posture: Secondary | ICD-10-CM | POA: Diagnosis not present

## 2018-04-07 DIAGNOSIS — M25622 Stiffness of left elbow, not elsewhere classified: Secondary | ICD-10-CM | POA: Diagnosis not present

## 2018-04-07 DIAGNOSIS — M25612 Stiffness of left shoulder, not elsewhere classified: Secondary | ICD-10-CM | POA: Diagnosis not present

## 2018-04-07 DIAGNOSIS — M6281 Muscle weakness (generalized): Secondary | ICD-10-CM | POA: Diagnosis not present

## 2018-04-07 DIAGNOSIS — Z4789 Encounter for other orthopedic aftercare: Secondary | ICD-10-CM | POA: Diagnosis not present

## 2018-04-10 DIAGNOSIS — G4733 Obstructive sleep apnea (adult) (pediatric): Secondary | ICD-10-CM | POA: Diagnosis not present

## 2018-04-28 DIAGNOSIS — G4733 Obstructive sleep apnea (adult) (pediatric): Secondary | ICD-10-CM | POA: Diagnosis not present

## 2018-04-28 DIAGNOSIS — J310 Chronic rhinitis: Secondary | ICD-10-CM | POA: Diagnosis not present

## 2018-04-28 DIAGNOSIS — G4700 Insomnia, unspecified: Secondary | ICD-10-CM | POA: Diagnosis not present

## 2018-05-08 DIAGNOSIS — M25512 Pain in left shoulder: Secondary | ICD-10-CM | POA: Diagnosis not present

## 2018-05-08 DIAGNOSIS — M25622 Stiffness of left elbow, not elsewhere classified: Secondary | ICD-10-CM | POA: Diagnosis not present

## 2018-05-08 DIAGNOSIS — M25611 Stiffness of right shoulder, not elsewhere classified: Secondary | ICD-10-CM | POA: Diagnosis not present

## 2018-05-08 DIAGNOSIS — M25511 Pain in right shoulder: Secondary | ICD-10-CM | POA: Diagnosis not present

## 2018-05-08 DIAGNOSIS — M25612 Stiffness of left shoulder, not elsewhere classified: Secondary | ICD-10-CM | POA: Diagnosis not present

## 2018-05-08 DIAGNOSIS — R293 Abnormal posture: Secondary | ICD-10-CM | POA: Diagnosis not present

## 2018-05-08 DIAGNOSIS — M6281 Muscle weakness (generalized): Secondary | ICD-10-CM | POA: Diagnosis not present

## 2018-05-08 DIAGNOSIS — Z4789 Encounter for other orthopedic aftercare: Secondary | ICD-10-CM | POA: Diagnosis not present

## 2018-06-05 DIAGNOSIS — M6281 Muscle weakness (generalized): Secondary | ICD-10-CM | POA: Diagnosis not present

## 2018-06-05 DIAGNOSIS — M25611 Stiffness of right shoulder, not elsewhere classified: Secondary | ICD-10-CM | POA: Diagnosis not present

## 2018-06-05 DIAGNOSIS — M25622 Stiffness of left elbow, not elsewhere classified: Secondary | ICD-10-CM | POA: Diagnosis not present

## 2018-06-05 DIAGNOSIS — M25511 Pain in right shoulder: Secondary | ICD-10-CM | POA: Diagnosis not present

## 2018-06-05 DIAGNOSIS — Z4789 Encounter for other orthopedic aftercare: Secondary | ICD-10-CM | POA: Diagnosis not present

## 2018-06-05 DIAGNOSIS — M25612 Stiffness of left shoulder, not elsewhere classified: Secondary | ICD-10-CM | POA: Diagnosis not present

## 2018-06-05 DIAGNOSIS — R293 Abnormal posture: Secondary | ICD-10-CM | POA: Diagnosis not present

## 2018-06-05 DIAGNOSIS — M25512 Pain in left shoulder: Secondary | ICD-10-CM | POA: Diagnosis not present

## 2018-06-09 DIAGNOSIS — M25532 Pain in left wrist: Secondary | ICD-10-CM | POA: Diagnosis not present

## 2018-06-09 DIAGNOSIS — G5603 Carpal tunnel syndrome, bilateral upper limbs: Secondary | ICD-10-CM | POA: Diagnosis not present

## 2018-06-09 DIAGNOSIS — M25531 Pain in right wrist: Secondary | ICD-10-CM | POA: Diagnosis not present

## 2018-06-13 DIAGNOSIS — G4733 Obstructive sleep apnea (adult) (pediatric): Secondary | ICD-10-CM | POA: Diagnosis not present

## 2018-06-13 DIAGNOSIS — G4700 Insomnia, unspecified: Secondary | ICD-10-CM | POA: Diagnosis not present

## 2018-06-13 DIAGNOSIS — J310 Chronic rhinitis: Secondary | ICD-10-CM | POA: Diagnosis not present

## 2018-06-26 DIAGNOSIS — G5603 Carpal tunnel syndrome, bilateral upper limbs: Secondary | ICD-10-CM | POA: Diagnosis not present

## 2018-07-04 DIAGNOSIS — G5603 Carpal tunnel syndrome, bilateral upper limbs: Secondary | ICD-10-CM | POA: Diagnosis not present

## 2018-07-27 DIAGNOSIS — F1011 Alcohol abuse, in remission: Secondary | ICD-10-CM | POA: Diagnosis not present

## 2018-07-27 DIAGNOSIS — F321 Major depressive disorder, single episode, moderate: Secondary | ICD-10-CM | POA: Diagnosis not present

## 2018-07-27 DIAGNOSIS — J301 Allergic rhinitis due to pollen: Secondary | ICD-10-CM | POA: Diagnosis not present

## 2018-07-27 DIAGNOSIS — E782 Mixed hyperlipidemia: Secondary | ICD-10-CM | POA: Diagnosis not present

## 2018-07-27 DIAGNOSIS — F5101 Primary insomnia: Secondary | ICD-10-CM | POA: Diagnosis not present

## 2018-07-27 DIAGNOSIS — G4733 Obstructive sleep apnea (adult) (pediatric): Secondary | ICD-10-CM | POA: Diagnosis not present

## 2018-07-27 DIAGNOSIS — M4802 Spinal stenosis, cervical region: Secondary | ICD-10-CM | POA: Diagnosis not present

## 2018-07-27 DIAGNOSIS — N429 Disorder of prostate, unspecified: Secondary | ICD-10-CM | POA: Diagnosis not present

## 2018-07-27 DIAGNOSIS — E039 Hypothyroidism, unspecified: Secondary | ICD-10-CM | POA: Diagnosis not present

## 2018-07-27 DIAGNOSIS — M170 Bilateral primary osteoarthritis of knee: Secondary | ICD-10-CM | POA: Diagnosis not present

## 2018-07-28 DIAGNOSIS — Z125 Encounter for screening for malignant neoplasm of prostate: Secondary | ICD-10-CM | POA: Diagnosis not present

## 2018-07-28 DIAGNOSIS — E039 Hypothyroidism, unspecified: Secondary | ICD-10-CM | POA: Diagnosis not present

## 2018-07-28 DIAGNOSIS — E782 Mixed hyperlipidemia: Secondary | ICD-10-CM | POA: Diagnosis not present

## 2018-07-28 DIAGNOSIS — R739 Hyperglycemia, unspecified: Secondary | ICD-10-CM | POA: Diagnosis not present

## 2018-07-28 DIAGNOSIS — F1011 Alcohol abuse, in remission: Secondary | ICD-10-CM | POA: Diagnosis not present

## 2018-07-28 DIAGNOSIS — N429 Disorder of prostate, unspecified: Secondary | ICD-10-CM | POA: Diagnosis not present

## 2018-08-10 DIAGNOSIS — M1711 Unilateral primary osteoarthritis, right knee: Secondary | ICD-10-CM | POA: Diagnosis not present

## 2018-08-10 DIAGNOSIS — M1712 Unilateral primary osteoarthritis, left knee: Secondary | ICD-10-CM | POA: Diagnosis not present

## 2018-08-17 DIAGNOSIS — M1712 Unilateral primary osteoarthritis, left knee: Secondary | ICD-10-CM | POA: Diagnosis not present

## 2018-08-24 DIAGNOSIS — M1712 Unilateral primary osteoarthritis, left knee: Secondary | ICD-10-CM | POA: Diagnosis not present

## 2018-08-29 DIAGNOSIS — G4700 Insomnia, unspecified: Secondary | ICD-10-CM | POA: Diagnosis not present

## 2018-08-29 DIAGNOSIS — J310 Chronic rhinitis: Secondary | ICD-10-CM | POA: Diagnosis not present

## 2018-08-29 DIAGNOSIS — G4733 Obstructive sleep apnea (adult) (pediatric): Secondary | ICD-10-CM | POA: Diagnosis not present

## 2018-11-14 DIAGNOSIS — G4733 Obstructive sleep apnea (adult) (pediatric): Secondary | ICD-10-CM | POA: Diagnosis not present

## 2018-12-08 DIAGNOSIS — M170 Bilateral primary osteoarthritis of knee: Secondary | ICD-10-CM | POA: Diagnosis not present

## 2018-12-22 DIAGNOSIS — G4733 Obstructive sleep apnea (adult) (pediatric): Secondary | ICD-10-CM | POA: Diagnosis not present

## 2018-12-22 DIAGNOSIS — J310 Chronic rhinitis: Secondary | ICD-10-CM | POA: Diagnosis not present

## 2018-12-22 DIAGNOSIS — G4700 Insomnia, unspecified: Secondary | ICD-10-CM | POA: Diagnosis not present

## 2019-01-11 DIAGNOSIS — G4733 Obstructive sleep apnea (adult) (pediatric): Secondary | ICD-10-CM | POA: Diagnosis not present

## 2019-01-25 DIAGNOSIS — Z01818 Encounter for other preprocedural examination: Secondary | ICD-10-CM | POA: Diagnosis not present

## 2019-01-25 DIAGNOSIS — E039 Hypothyroidism, unspecified: Secondary | ICD-10-CM | POA: Diagnosis not present

## 2019-01-25 DIAGNOSIS — R001 Bradycardia, unspecified: Secondary | ICD-10-CM | POA: Diagnosis not present

## 2019-01-25 DIAGNOSIS — R739 Hyperglycemia, unspecified: Secondary | ICD-10-CM | POA: Diagnosis not present

## 2019-01-25 DIAGNOSIS — E782 Mixed hyperlipidemia: Secondary | ICD-10-CM | POA: Diagnosis not present

## 2019-01-25 DIAGNOSIS — R17 Unspecified jaundice: Secondary | ICD-10-CM | POA: Diagnosis not present

## 2019-02-06 DIAGNOSIS — E782 Mixed hyperlipidemia: Secondary | ICD-10-CM | POA: Diagnosis not present

## 2019-02-06 DIAGNOSIS — Z01810 Encounter for preprocedural cardiovascular examination: Secondary | ICD-10-CM | POA: Diagnosis not present

## 2019-02-06 DIAGNOSIS — R001 Bradycardia, unspecified: Secondary | ICD-10-CM | POA: Diagnosis not present

## 2019-02-06 DIAGNOSIS — G4733 Obstructive sleep apnea (adult) (pediatric): Secondary | ICD-10-CM | POA: Diagnosis not present

## 2019-02-06 DIAGNOSIS — Z720 Tobacco use: Secondary | ICD-10-CM | POA: Diagnosis not present

## 2019-02-13 DIAGNOSIS — R001 Bradycardia, unspecified: Secondary | ICD-10-CM | POA: Diagnosis not present

## 2019-02-13 DIAGNOSIS — E039 Hypothyroidism, unspecified: Secondary | ICD-10-CM | POA: Diagnosis not present

## 2019-02-13 DIAGNOSIS — Z01810 Encounter for preprocedural cardiovascular examination: Secondary | ICD-10-CM | POA: Diagnosis not present

## 2019-02-14 DIAGNOSIS — Z01810 Encounter for preprocedural cardiovascular examination: Secondary | ICD-10-CM | POA: Diagnosis not present

## 2019-02-14 DIAGNOSIS — R001 Bradycardia, unspecified: Secondary | ICD-10-CM | POA: Diagnosis not present

## 2019-02-22 DIAGNOSIS — R001 Bradycardia, unspecified: Secondary | ICD-10-CM | POA: Diagnosis not present

## 2019-02-23 DIAGNOSIS — R001 Bradycardia, unspecified: Secondary | ICD-10-CM | POA: Diagnosis not present

## 2019-02-28 NOTE — H&P (Signed)
TOTAL KNEE ADMISSION H&P  Patient is being admitted for left total knee arthroplasty.  Subjective:  Chief Complaint:left knee pain.  HPI: Cameron Jackson, 68 y.o. male, has a history of pain and functional disability in the left knee due to arthritis and has failed non-surgical conservative treatments for greater than 12 weeks to includecorticosteriod injections, viscosupplementation injections and activity modification.  Onset of symptoms was gradual, starting >10 years ago with gradually worsening course since that time. The patient noted multiple arthroscopies on the left knee(s).  Patient currently rates pain in the left knee(s) at 6 out of 10 with activity. Patient has worsening of pain with activity and weight bearing, crepitus and joint swelling.  Patient has evidence of tricompartmental bone-on-bone changes in the left knee by imaging studies. There is no active infection.  Patient Active Problem List   Diagnosis Date Noted   Cervical stenosis of spinal canal 02/28/2013   Dyspnea 07/15/2010   Past Medical History:  Diagnosis Date   Arthritis    Osteo Arthritis   Complication of anesthesia    hypothermia- 20 years ago.  Aggessive behavior - with `endo' years ago.   Hyperlipidemia    Hypertension    not on medication now. 140/80s is high for patient.   Hypothyroidism    Mental disorder    Sleep apnea    CPAP   Stroke [facility])    "mini" stroke per MRI    Past Surgical History:  Procedure Laterality Date   ANTERIOR CERVICAL DECOMPRESSION/DISCECTOMY FUSION 4 LEVELS N/A 02/28/2013   Procedure: ANTERIOR CERVICAL DECOMPRESSION/DISCECTOMY FUSION 4 LEVELS;  Surgeon: Karn Cassis, MD;  Location: [facility];  Service: Neurosurgery;  Laterality: N/A;  C3-4 C4-5 C5-6 C6-7 Anterior cervical decompression/diskectomy/fusion   COLONOSCOPY     KNEE SURGERY  [phone]   left   TONSILLECTOMY      No current facility-administered medications for this encounter.     Current Outpatient Medications  Medication Sig Dispense Refill Last Dose   ALPRAZolam (XANAX) 0.5 MG tablet Take 2 tablets approximately 45 minutes prior to the MRI study, take a third tablet if needed. 3 tablet 0    aspirin (BAYER ASPIRIN) 325 MG tablet Take 1 tablet (325 mg total) by mouth daily.      atorvastatin (LIPITOR) 40 MG tablet Take 40 mg by mouth daily.   Taking   cholecalciferol (VITAMIN D) 1000 UNITS tablet Take 6,000 Units by mouth daily.   Taking   escitalopram (LEXAPRO) 10 MG tablet Take 10 mg by mouth 3 (three) times daily.    Taking   levothyroxine (SYNTHROID, LEVOTHROID) 125 MCG tablet Take 125 mcg by mouth daily before breakfast.   Taking   tamsulosin (FLOMAX) 0.4 MG CAPS capsule Take 0.4 mg by mouth daily.    Taking   zolpidem (AMBIEN CR) 6.25 MG CR tablet Take 6.25 mg by mouth at bedtime as needed for sleep.   Taking   Allergies  Allergen Reactions   Other Swelling    Bee Stings- swelling of face.    Social History   Tobacco Use   Smoking status: Former Smoker    Packs/day: 2.00    Years: 11.00    Pack years: 22.00    Types: Cigarettes    Quit date: 04/05/1981    Years since quitting: 37.9   Smokeless tobacco: Former Neurosurgeon    Types: Chew    Quit date: 04/05/2004  Substance Use Topics   Alcohol use: No    Comment: beer  6 days per wk.. quit    Family History  Problem Relation Age of Onset   Allergies Father    Heart disease Father    Allergies Brother    Heart disease Mother      Review of Systems  Constitutional: Negative for chills and fever.  HENT: Negative for congestion, sore throat and tinnitus.   Eyes: Negative for double vision, photophobia and pain.  Respiratory: Negative for cough, shortness of breath and wheezing.   Cardiovascular: Negative for chest pain, palpitations and orthopnea.  Gastrointestinal: Negative for heartburn, nausea and vomiting.  Genitourinary: Negative for dysuria, frequency and urgency.   Musculoskeletal: Positive for joint pain.  Neurological: Negative for dizziness, weakness and headaches.    Objective:  Physical Exam  Well nourished and well developed.  General: Alert and oriented x3, cooperative and pleasant, no acute distress.  Head: normocephalic, atraumatic, neck supple.  Eyes: EOMI.  Respiratory: breath sounds clear in all fields, no wheezing, rales, or rhonchi. Cardiovascular: Regular rate and rhythm, no murmurs, gallops or rubs.  Abdomen: non-tender to palpation and soft, normoactive bowel sounds. Musculoskeletal:  Left Knee Exam: Trace effusion. Range of motion is 0-115 degrees. Positive crepitus on range of motion of the knee. Positive medial joint line tenderness Positive lateral joint line tenderness. AP laxity from his previous ACL tear. No gross instability is noted.  Calves soft and nontender. Motor function intact in LE. Strength 5/5 LE bilaterally. Neuro: Distal pulses 2+. Sensation to light touch intact in LE.  Vital signs in last 24 hours: Blood pressure: 130/82 mmHg Pulse: 56 bpm  Labs:   Estimated body mass index is 31.89 kg/m as calculated from the following:   Height as of 11/11/17: 6\' 4"  (1.93 m).   Weight as of 11/11/17: 118.8 kg.   Imaging Review Plain radiographs demonstrate severe degenerative joint disease of the left knee(s). The overall alignment isneutral. The bone quality appears to be adequate for age and reported activity level.   Assessment/Plan:  End stage arthritis, left knee   The patient history, physical examination, clinical judgment of the provider and imaging studies are consistent with end stage degenerative joint disease of the left knee(s) and total knee arthroplasty is deemed medically necessary. The treatment options including medical management, injection therapy arthroscopy and arthroplasty were discussed at length. The risks and benefits of total knee arthroplasty were presented and reviewed. The risks  due to aseptic loosening, infection, stiffness, patella tracking problems, thromboembolic complications and other imponderables were discussed. The patient acknowledged the explanation, agreed to proceed with the plan and consent was signed. Patient is being admitted for inpatient treatment for surgery, pain control, PT, OT, prophylactic antibiotics, VTE prophylaxis, progressive ambulation and ADL's and discharge planning. The patient is planning to be discharged home.  Anticipated LOS equal to or greater than 2 midnights due to - Age 66 and older with one or more of the following:  - Obesity  - Expected need for hospital services (PT, OT, Nursing) required for safe  discharge  - Anticipated need for postoperative skilled nursing care or inpatient rehab  - Active co-morbidities: Stroke OR   - Unanticipated findings during/Post Surgery: None  - Patient is a high risk of re-admission due to: None  Therapy Plans[facility] Disposition: Home with wife Planned DVT Prophylaxis: Aspirin 325 mg BID DME needed: Dan HumphreysWalker PCP: Koren BoundKaren Goins, FNP - has been cleared TXA: IV Allergies: NKDA Anesthesia Concerns: None BMI: 31.6 Other: Pt with bradycardia - HR  has dropped down to 38 while asleep wearing Holter monitor. Has been completely worked up by [facility] cardiologist and is stable.  - Patient was instructed on what medications to stop prior to surgery. - Follow-up visit in 2 weeks with Dr. Wynelle Link - Begin physical therapy following surgery - Pre-operative lab work as pre-surgical testing - Prescriptions will be provided in hospital at time of discharge  Theresa Duty, PA-C Orthopedic Surgery [facility] [place]

## 2019-03-06 DIAGNOSIS — G4733 Obstructive sleep apnea (adult) (pediatric): Secondary | ICD-10-CM | POA: Diagnosis not present

## 2019-03-21 NOTE — Patient Instructions (Addendum)
DUE TO COVID-19 ONLY ONE VISITOR IS ALLOWED TO COME WITH YOU AND STAY IN THE WAITING ROOM ONLY DURING PRE OP AND PROCEDURE DAY OF SURGERY. THE 1 VISITOR MAY VISIT WITH YOU AFTER SURGERY IN YOUR PRIVATE ROOM DURING VISITING HOURS ONLY!  YOU NEED TO HAVE A COVID 19 TEST ON_Thursday 12/17/2020______ :40 pm______, THIS TEST MUST BE DONE BEFORE SURGERY, COME  [address], [place] , [address].  [facility]) ONCE YOUR COVID TEST IS COMPLETED, PLEASE BEGIN THE QUARANTINE INSTRUCTIONS AS OUTLINED IN YOUR HANDOUT.                Cameron Jackson    Your procedure is scheduled on: Monday 03/26/2019   Report to [facility] Main  Entrance    Report to Short Stay at 5:30 am     Call this number if you have problems the morning of surgery [phone]    Remember: Do not eat food after Midnight.    BRUSH YOUR TEETH MORNING OF SURGERY AND RINSE YOUR MOUTH OUT, NO CHEWING GUM CANDY OR MINTS.     CLEAR LIQUID DIET   Foods Allowed                                                                     Foods Excluded  Coffee and tea, regular and decaf                             liquids that you cannot  Plain Jell-O any favor except red or purple             see through such as: Fruit ices (not with fruit pulp)                                     milk, soups, orange juice  Iced Popsicles                                    All solid food Carbonated beverages, regular and diet                                    Cranberry, grape and apple juices Sports drinks like Gatorade Lightly seasoned clear broth or consume(fat free) Sugar, honey syrup  Sample Menu Breakfast                                Lunch                                     Supper Cranberry juice                    Beef broth  Chicken broth Jell-O                                     Grape juice                           Apple juice Coffee or tea                        Jell-O                                       Popsicle                                                Coffee or tea                        Coffee or tea  _____________________________________________________________________  NO SOLID FOOD AFTER MIDNIGHT THE NIGHT PRIOR TO SURGERY.   NOTHING BY MOUTH EXCEPT CLEAR LIQUIDS UNTIL 4:15 am.   PLEASE FINISH ENSURE DRINK PER SURGEON ORDER  WHICH NEEDS TO BE COMPLETED AT 4:15 am.     Take these medicines the morning of surgery with A SIP OF WATER: Escitalopram (Lexapro), Levothyroxine (Synthroid)   Please bring CPAP mask and tubing with you to the hospital.                                  You may not have any metal on your body including hair pins and              piercings  Do not wear jewelry, make-up, lotions, powders or perfumes, deodorant             Do not wear nail polish on your fingernails.  Do not shave  48 hours prior to surgery.                Do not bring valuables to the hospital. [place] IS NOT             RESPONSIBLE   FOR VALUABLES.  Contacts, dentures or bridgework may not be worn into surgery.  Leave suitcase in the car. After surgery it may be brought to your room.                Please read over the following fact sheets you were given: _____________________________________________________________________             [facility] - Preparing for Surgery Before surgery, you can play an important role.  Because skin is not sterile, your skin needs to be as free of germs as possible.  You can reduce the number of germs on your skin by washing with CHG (chlorahexidine gluconate) soap before surgery.  CHG is an antiseptic cleaner which kills germs and bonds with the skin to continue killing germs even after washing. Please DO NOT use if you have an allergy to CHG or antibacterial soaps.  If your skin becomes reddened/irritated stop using the CHG and inform your nurse when you arrive at Short Stay.  Do not shave (including legs and  underarms) for at least 48 hours prior to the first CHG shower.  You may shave your face/neck. Please follow these instructions carefully:  1.  Shower with CHG Soap the night before surgery and the  morning of Surgery.  2.  If you choose to wash your hair, wash your hair first as usual with your  normal  shampoo.  3.  After you shampoo, rinse your hair and body thoroughly to remove the  shampoo.                            4.  Use CHG as you would any other liquid soap.  You can apply chg directly  to the skin and wash                       Gently with a scrungie or clean washcloth.  5.  Apply the CHG Soap to your body ONLY FROM THE NECK DOWN.   Do not use on face/ open                           Wound or open sores. Avoid contact with eyes, ears mouth and genitals (private parts).                       Wash face,  Genitals (private parts) with your normal soap.             6.  Wash thoroughly, paying special attention to the area where your surgery  will be performed.  7.  Thoroughly rinse your body with warm water from the neck down.  8.  DO NOT shower/wash with your normal soap after using and rinsing off  the CHG Soap.                9.  Pat yourself dry with a clean towel.            10.  Wear clean pajamas.            11.  Place clean sheets on your bed the night of your first shower and do not  sleep with pets. Day of Surgery : Do not apply any lotions/deodorants the morning of surgery.  Please wear clean clothes to the hospital/surgery center.  FAILURE TO FOLLOW THESE INSTRUCTIONS MAY RESULT IN THE CANCELLATION OF YOUR SURGERY PATIENT SIGNATURE_________________________________  NURSE SIGNATURE__________________________________  ________________________________________________________________________   Cameron Jackson  An incentive spirometer is a tool that can help keep your lungs clear and active. This tool measures how well you are filling your lungs with each breath. Taking  long deep breaths may help reverse or decrease the chance of developing breathing (pulmonary) problems (especially infection) following:  A long period of time when you are unable to move or be active. BEFORE THE PROCEDURE   If the spirometer includes an indicator to show your best effort, your nurse or respiratory therapist will set it to a desired goal.  If possible, sit up straight or lean slightly forward. Try not to slouch.  Hold the incentive spirometer in an upright position. INSTRUCTIONS FOR USE  1. Sit on the edge of your bed if possible, or sit up as far as you can in bed or on a chair. 2. Hold the incentive spirometer in an upright position. 3. Breathe out normally. 4. Place the  mouthpiece in your mouth and seal your lips tightly around it. 5. Breathe in slowly and as deeply as possible, raising the piston or the ball toward the top of the column. 6. Hold your breath for 3-5 seconds or for as long as possible. Allow the piston or ball to fall to the bottom of the column. 7. Remove the mouthpiece from your mouth and breathe out normally. 8. Rest for a few seconds and repeat Steps 1 through 7 at least 10 times every 1-2 hours when you are awake. Take your time and take a few normal breaths between deep breaths. 9. The spirometer may include an indicator to show your best effort. Use the indicator as a goal to work toward during each repetition. 10. After each set of 10 deep breaths, practice coughing to be sure your lungs are clear. If you have an incision (the cut made at the time of surgery), support your incision when coughing by placing a pillow or rolled up towels firmly against it. Once you are able to get out of bed, walk around indoors and cough well. You may stop using the incentive spirometer when instructed by your caregiver.  RISKS AND COMPLICATIONS  Take your time so you do not get dizzy or light-headed.  If you are in pain, you may need to take or ask for pain  medication before doing incentive spirometry. It is harder to take a deep breath if you are having pain. AFTER USE  Rest and breathe slowly and easily.  It can be helpful to keep track of a log of your progress. Your caregiver can provide you with a simple table to help with this. If you are using the spirometer at home, follow these instructions: [place] IF:   You are having difficultly using the spirometer.  You have trouble using the spirometer as often as instructed.  Your pain medication is not giving enough relief while using the spirometer.  You develop fever of 100.5 F (38.1 C) or higher. SEEK IMMEDIATE MEDICAL CARE IF:   You cough up bloody sputum that had not been present before.  You develop fever of 102 F (38.9 C) or greater.  You develop worsening pain at or near the incision site. MAKE SURE YOU:   Understand these instructions.  Will watch your condition.  Will get help right away if you are not doing well or get worse. Document Released: 08/02/2006 Document Revised: 06/14/2011 Document Reviewed: 10/03/2006 ExitCare Patient Information 2014 ExitCare, [place].   ________________________________________________________________________  WHAT IS A BLOOD TRANSFUSION? Blood Transfusion Information  A transfusion is the replacement of blood or some of its parts. Blood is made up of multiple cells which provide different functions.  Red blood cells carry oxygen and are used for blood loss replacement.  White blood cells fight against infection.  Platelets control bleeding.  Plasma helps clot blood.  Other blood products are available for specialized needs, such as hemophilia or other clotting disorders. BEFORE THE TRANSFUSION  Who gives blood for transfusions?   Healthy volunteers who are fully evaluated to make sure their blood is safe. This is blood bank blood. Transfusion therapy is the safest it has ever been in the practice of medicine.  Before blood is taken from a donor, a complete history is taken to make sure that person has no history of diseases nor engages in risky social behavior (examples are intravenous drug use or sexual activity with multiple partners). The donor's travel history is screened to  minimize risk of transmitting infections, such as malaria. The donated blood is tested for signs of infectious diseases, such as HIV and hepatitis. The blood is then tested to be sure it is compatible with you in order to minimize the chance of a transfusion reaction. If you or a relative donates blood, this is often done in anticipation of surgery and is not appropriate for emergency situations. It takes many days to process the donated blood. RISKS AND COMPLICATIONS Although transfusion therapy is very safe and saves many lives, the main dangers of transfusion include:   Getting an infectious disease.  Developing a transfusion reaction. This is an allergic reaction to something in the blood you were given. Every precaution is taken to prevent this. The decision to have a blood transfusion has been considered carefully by your caregiver before blood is given. Blood is not given unless the benefits outweigh the risks. AFTER THE TRANSFUSION  Right after receiving a blood transfusion, you will usually feel much better and more energetic. This is especially true if your red blood cells have gotten low (anemic). The transfusion raises the level of the red blood cells which carry oxygen, and this usually causes an energy increase.  The nurse administering the transfusion will monitor you carefully for complications. HOME CARE INSTRUCTIONS  No special instructions are needed after a transfusion. You may find your energy is better. Speak with your caregiver about any limitations on activity for underlying diseases you may have. SEEK MEDICAL CARE IF:   Your condition is not improving after your transfusion.  You develop redness or  irritation at the intravenous (IV) site. SEEK IMMEDIATE MEDICAL CARE IF:  Any of the following symptoms occur over the next 12 hours:  Shaking chills.  You have a temperature by mouth above 102 F (38.9 C), not controlled by medicine.  Chest, back, or muscle pain.  People around you feel you are not acting correctly or are confused.  Shortness of breath or difficulty breathing.  Dizziness and fainting.  You get a rash or develop hives.  You have a decrease in urine output.  Your urine turns a dark color or changes to pink, red, or [place]. Any of the following symptoms occur over the next 10 days:  You have a temperature by mouth above 102 F (38.9 C), not controlled by medicine.  Shortness of breath.  Weakness after normal activity.  The white part of the eye turns yellow (jaundice).  You have a decrease in the amount of urine or are urinating Cameron often.  Your urine turns a dark color or changes to pink, red, or [place]an. Document Released: 03/19/2000 Document Revised: 06/14/2011 Document Reviewed: 11/06/2007 [facility] Patient Information 2014 [place], [place].  _______________________________________________________________________

## 2019-03-21 NOTE — Progress Notes (Signed)
PCP - Sheryn Bison MD Cardiologist - [facility] physician   Chest x-ray - none EKG - 03/22/2019 Stress Test - none ECHO - none Cardiac Cath - none  Sleep Study - 2020 CPAP - settings are 12  Fasting Blood Sugar -  Checks Blood Sugar _____ times a day  Blood Thinner Instructions: Aspirin Instructions: ASA 325mg   Last Dose: at least a month ago  Anesthesia review: Janett Billow please review, Pt stated that he has a low heart rate, he sees a cardiologist with [facility]. He said that he was cleared and Aluisio should have the clearance letter.  Chart sent to Konrad Felix PA for review.  Patient denies shortness of breath, fever, cough and chest pain at PAT appointment   Patient verbalized understanding of instructions that were given to them at the PAT appointment. Patient was also instructed that they will need to review over the PAT instructions again at home before surgery.

## 2019-03-22 ENCOUNTER — Other Ambulatory Visit (HOSPITAL_COMMUNITY): Payer: PPO

## 2019-03-22 ENCOUNTER — Encounter (HOSPITAL_COMMUNITY)
Admission: RE | Admit: 2019-03-22 | Discharge: 2019-03-22 | Disposition: A | Discharge status: Home / Self Care | Payer: PPO | Source: Ambulatory Visit | Attending: Orthopedic Surgery | Admitting: Orthopedic Surgery

## 2019-03-22 ENCOUNTER — Other Ambulatory Visit (HOSPITAL_COMMUNITY)
Admission: RE | Admit: 2019-03-22 | Discharge: 2019-03-22 | Disposition: A | Discharge status: Home / Self Care | Payer: PPO | Source: Ambulatory Visit | Attending: Orthopedic Surgery | Admitting: Orthopedic Surgery

## 2019-03-22 ENCOUNTER — Other Ambulatory Visit: Payer: Self-pay

## 2019-03-22 ENCOUNTER — Encounter (HOSPITAL_COMMUNITY): Payer: Self-pay

## 2019-03-22 DIAGNOSIS — M4322 Fusion of spine, cervical region: Secondary | ICD-10-CM | POA: Diagnosis not present

## 2019-03-22 DIAGNOSIS — Z791 Long term (current) use of non-steroidal anti-inflammatories (NSAID): Secondary | ICD-10-CM | POA: Diagnosis not present

## 2019-03-22 DIAGNOSIS — Z87891 Personal history of nicotine dependence: Secondary | ICD-10-CM | POA: Diagnosis not present

## 2019-03-22 DIAGNOSIS — F99 Mental disorder, not otherwise specified: Secondary | ICD-10-CM | POA: Diagnosis not present

## 2019-03-22 DIAGNOSIS — E785 Hyperlipidemia, unspecified: Secondary | ICD-10-CM | POA: Diagnosis not present

## 2019-03-22 DIAGNOSIS — Z8673 Personal history of transient ischemic attack (TIA), and cerebral infarction without residual deficits: Secondary | ICD-10-CM | POA: Diagnosis not present

## 2019-03-22 DIAGNOSIS — Z7982 Long term (current) use of aspirin: Secondary | ICD-10-CM | POA: Diagnosis not present

## 2019-03-22 DIAGNOSIS — E039 Hypothyroidism, unspecified: Secondary | ICD-10-CM | POA: Insufficient documentation

## 2019-03-22 DIAGNOSIS — Z79890 Hormone replacement therapy (postmenopausal): Secondary | ICD-10-CM | POA: Diagnosis not present

## 2019-03-22 DIAGNOSIS — R001 Bradycardia, unspecified: Secondary | ICD-10-CM | POA: Insufficient documentation

## 2019-03-22 DIAGNOSIS — Z20828 Contact with and (suspected) exposure to other viral communicable diseases: Secondary | ICD-10-CM | POA: Diagnosis not present

## 2019-03-22 DIAGNOSIS — Z01818 Encounter for other preprocedural examination: Secondary | ICD-10-CM | POA: Diagnosis not present

## 2019-03-22 DIAGNOSIS — Z79899 Other long term (current) drug therapy: Secondary | ICD-10-CM | POA: Insufficient documentation

## 2019-03-22 DIAGNOSIS — G4730 Sleep apnea, unspecified: Secondary | ICD-10-CM | POA: Diagnosis not present

## 2019-03-22 DIAGNOSIS — M1712 Unilateral primary osteoarthritis, left knee: Secondary | ICD-10-CM | POA: Diagnosis not present

## 2019-03-22 DIAGNOSIS — I10 Essential (primary) hypertension: Secondary | ICD-10-CM | POA: Insufficient documentation

## 2019-03-22 LAB — CBC
HCT: 47.6 % (ref 39.0–52.0)
Hemoglobin: 16.3 g/dL (ref 13.0–17.0)
MCH: 33.6 pg (ref 26.0–34.0)
MCHC: 34.2 g/dL (ref 30.0–36.0)
MCV: 98.1 fL (ref 80.0–100.0)
Platelets: 213 10*3/uL (ref 150–400)
RBC: 4.85 MIL/uL (ref 4.22–5.81)
RDW: 11.4 % — ABNORMAL LOW (ref 11.5–15.5)
WBC: 5 10*3/uL (ref 4.0–10.5)
nRBC: 0 % (ref 0.0–0.2)

## 2019-03-22 LAB — COMPREHENSIVE METABOLIC PANEL
ALT: 41 U/L (ref 0–44)
AST: 28 U/L (ref 15–41)
Albumin: 4.8 g/dL (ref 3.5–5.0)
Alkaline Phosphatase: 66 U/L (ref 38–126)
Anion gap: 9 (ref 5–15)
BUN: 13 mg/dL (ref 8–23)
CO2: 27 mmol/L (ref 22–32)
Calcium: 9.3 mg/dL (ref 8.9–10.3)
Chloride: 106 mmol/L (ref 98–111)
Creatinine, Ser: 0.84 mg/dL (ref 0.61–1.24)
GFR calc Af Amer: >60 mL/min (ref >60)
GFR calc non Af Amer: >60 mL/min (ref >60)
Glucose, Bld: 108 mg/dL — ABNORMAL HIGH (ref 70–99)
Potassium: 4.2 mmol/L (ref 3.5–5.1)
Sodium: 142 mmol/L (ref 135–145)
Total Bilirubin: 1.4 mg/dL — ABNORMAL HIGH (ref 0.3–1.2)
Total Protein: 7.7 g/dL (ref 6.5–8.1)

## 2019-03-22 LAB — PROTIME-INR
INR: 0.9 (ref 0.8–1.2)
Prothrombin Time: 12.1 seconds (ref 11.4–15.2)

## 2019-03-22 LAB — SURGICAL PCR SCREEN
MRSA, PCR: NEGATIVE
Staphylococcus aureus: NEGATIVE

## 2019-03-22 LAB — ABO/RH: ABO/RH(D): A POS

## 2019-03-22 LAB — APTT: aPTT: 31 seconds (ref 24–36)

## 2019-03-23 LAB — NOVEL CORONAVIRUS, NAA (HOSP ORDER, SEND-OUT TO REF LAB; TAT 18-24 HRS): SARS-CoV-2, NAA: NOT DETECTED

## 2019-03-23 NOTE — Progress Notes (Signed)
Anesthesia Chart Review   Case: [id number] Date/Time: 03/26/19 0700   Procedure: TOTAL KNEE ARTHROPLASTY (Left Knee) -   Anesthesia type: Choice   Pre-op diagnosis: left knee osteoarthritis   Location: [facility] ROOM 09 / [facility]   Surgeons: Ollen Gross, MD      DISCUSSION:68 y.o. former smoker (22 pack years, quit 04/05/81) with h/o HTN, hypothyroidism, sleep apnea w/cpap, stroke, asymptomatic bradycardia, left knee OA scheduled for above procedure 03/26/2019 with Dr. Ollen Gross.   Pt referred to cardiologist for evaluation of asymptomatic bradycardia and preoperative evaluation.  Seen by Dr. Clydie Braun 02/22/2019.  Per OV note, "Sinus node dysfunction that is asymptomatic and mild-I would avoid medicines that would adversely affect sinus node function such as beta-blockers.  I think he could have surgery safely without consideration of a temporary pacemaker as he does not have excessively slow heart rates.  I think his sinus node will respond to catecholamine challenge or atropine if required.  No extra precautions need to be take perioperative. Degenerative joint disease progressing to need for knee replacement-he should be stable for surgery and anesthesia.   Lexiscan 02/13/2019, uneventful study.   Anticipate pt can proceed with planned procedure barring acute status change.    VS: BP 135/84   Pulse (!) 52   Temp 37.1 C (Oral)   Resp 16   Ht 6\' 4"  (1.93 m)   Wt 117.8 kg   SpO2 100%   BMI 31.60 kg/m   PROVIDERS: , FNP is PCP   Audie Pinto, MD is Cardiologist  LABS: Labs reviewed: Acceptable for surgery. (all labs ordered are listed, but only abnormal results are displayed)  Labs Reviewed  CBC - Abnormal; Notable for the following components:      Result Value   RDW 11.4 (*)    All other components within normal limits  COMPREHENSIVE METABOLIC PANEL - Abnormal; Notable for the following components:   Glucose, Bld 108 (*)    Total Bilirubin 1.4  (*)    All other components within normal limits  SURGICAL PCR SCREEN  APTT  PROTIME-INR  TYPE AND SCREEN  ABO/RH     IMAGES:   EKG: 03/22/2019 Rate 52 bpm Sinus bradycardia Otherwise normal ECG Since last tracing rate slower   CV: Echo 02/13/2019 Summary There is no prior echocardiogram for comparison. Normal left ventricular systolic function Ejection fraction is visually estimated at 55-60% Moderate concentric left ventricular hypertrophy Diastolic function Appears normal Signature Past Medical History:  Diagnosis Date  . Arthritis    Osteo Arthritis  . Complication of anesthesia    hypothermia- 20 years ago.  Aggessive behavior - with `endo' years ago.  13/01/2019 Hyperlipidemia   . Hypertension    not on medication now. 140/80s is high for patient.  . Hypothyroidism   . Mental disorder   . Sleep apnea    CPAP  . Stroke [facility])    "mini" stroke per MRI    Past Surgical History:  Procedure Laterality Date  . ANTERIOR CERVICAL DECOMPRESSION/DISCECTOMY FUSION 4 LEVELS N/A 02/28/2013   Procedure: ANTERIOR CERVICAL DECOMPRESSION/DISCECTOMY FUSION 4 LEVELS;  Surgeon: 03/02/2013, MD;  Location: [facility];  Service: Neurosurgery;  Laterality: N/A;  C3-4 C4-5 C5-6 C6-7 Anterior cervical decompression/diskectomy/fusion  . COLONOSCOPY    . KNEE SURGERY  [phone]   left  . TONSILLECTOMY      MEDICATIONS: . aspirin (BAYER ASPIRIN) 325 MG tablet  . atorvastatin (LIPITOR) 40 MG tablet  . cetirizine (  ZYRTEC) 10 MG tablet  . Cholecalciferol (VITAMIN D) 50 MCG (2000 UT) CAPS  . escitalopram (LEXAPRO) 10 MG tablet  . levothyroxine (SYNTHROID, LEVOTHROID) 125 MCG tablet  . meloxicam (MOBIC) 15 MG tablet  . tamsulosin (FLOMAX) 0.4 MG CAPS capsule  . zolpidem (AMBIEN) 10 MG tablet   No current facility-administered medications for this encounter.   Maia Plan [facility] Pre-Surgical Testing [phone] 03/23/19  9:17 AM

## 2019-03-23 NOTE — Anesthesia Preprocedure Evaluation (Addendum)
Anesthesia Evaluation  Patient identified by MRN, date of birth, ID band Patient awake    Reviewed: Allergy & Precautions, NPO status , Patient's Chart, lab work & pertinent test results  History of Anesthesia Complications (+) history of anesthetic complications  Airway Mallampati: II  TM Distance: >3 FB Neck ROM: Full    Dental  (+) Dental Advisory Given   Pulmonary shortness of breath, sleep apnea , neg recent URI, former smoker,    breath sounds clear to auscultation       Cardiovascular hypertension,  Rhythm:Regular     Neuro/Psych CVA    GI/Hepatic negative GI ROS, Neg liver ROS,   Endo/Other  Hypothyroidism   Renal/GU negative Renal ROS     Musculoskeletal  (+) Arthritis ,   Abdominal   Peds  Hematology negative hematology ROS (+)   Anesthesia Other Findings   Reproductive/Obstetrics                           Anesthesia Physical Anesthesia Plan  ASA: II  Anesthesia Plan: MAC, Regional and Spinal   Post-op Pain Management:    Induction:   PONV Risk Score and Plan: 1 and Treatment may vary due to age or medical condition and Propofol infusion  Airway Management Planned: Nasal Cannula  Additional Equipment: None  Intra-op Plan:   Post-operative Plan:   Informed Consent: I have reviewed the patients History and Physical, chart, labs and discussed the procedure including the risks, benefits and alternatives for the proposed anesthesia with the patient or authorized representative who has indicated his/her understanding and acceptance.     Dental advisory given  Plan Discussed with: CRNA and Surgeon  Anesthesia Plan Comments: (See PAT note 03/22/2019, Konrad Felix, PA-C)       Anesthesia Quick Evaluation

## 2019-03-26 ENCOUNTER — Other Ambulatory Visit: Payer: Self-pay

## 2019-03-26 ENCOUNTER — Ambulatory Visit (HOSPITAL_COMMUNITY): Payer: PPO | Admitting: Physician Assistant

## 2019-03-26 ENCOUNTER — Encounter (HOSPITAL_COMMUNITY)
Admission: RE | Disposition: A | Discharge status: Home / Self Care | Payer: Self-pay | Source: Home / Self Care | Attending: Orthopedic Surgery

## 2019-03-26 ENCOUNTER — Ambulatory Visit (HOSPITAL_COMMUNITY): Payer: PPO | Admitting: Certified Registered"

## 2019-03-26 ENCOUNTER — Encounter (HOSPITAL_COMMUNITY): Payer: Self-pay | Admitting: Orthopedic Surgery

## 2019-03-26 ENCOUNTER — Inpatient Hospital Stay (HOSPITAL_COMMUNITY)
Admission: RE | Admit: 2019-03-26 | Discharge: 2019-03-26 | DRG: 470 | Disposition: A | Discharge status: Home / Self Care | Payer: PPO | Attending: Orthopedic Surgery | Admitting: Orthopedic Surgery

## 2019-03-26 DIAGNOSIS — Z91030 Bee allergy status: Secondary | ICD-10-CM | POA: Diagnosis not present

## 2019-03-26 DIAGNOSIS — M25762 Osteophyte, left knee: Secondary | ICD-10-CM | POA: Diagnosis not present

## 2019-03-26 DIAGNOSIS — M179 Osteoarthritis of knee, unspecified: Secondary | ICD-10-CM | POA: Diagnosis present

## 2019-03-26 DIAGNOSIS — I10 Essential (primary) hypertension: Secondary | ICD-10-CM | POA: Diagnosis present

## 2019-03-26 DIAGNOSIS — Z8249 Family history of ischemic heart disease and other diseases of the circulatory system: Secondary | ICD-10-CM | POA: Diagnosis not present

## 2019-03-26 DIAGNOSIS — Z981 Arthrodesis status: Secondary | ICD-10-CM | POA: Diagnosis not present

## 2019-03-26 DIAGNOSIS — Z79899 Other long term (current) drug therapy: Secondary | ICD-10-CM

## 2019-03-26 DIAGNOSIS — Z8673 Personal history of transient ischemic attack (TIA), and cerebral infarction without residual deficits: Secondary | ICD-10-CM | POA: Diagnosis not present

## 2019-03-26 DIAGNOSIS — M1712 Unilateral primary osteoarthritis, left knee: Secondary | ICD-10-CM | POA: Diagnosis present

## 2019-03-26 DIAGNOSIS — E785 Hyperlipidemia, unspecified: Secondary | ICD-10-CM | POA: Diagnosis not present

## 2019-03-26 DIAGNOSIS — Z7982 Long term (current) use of aspirin: Secondary | ICD-10-CM

## 2019-03-26 DIAGNOSIS — E039 Hypothyroidism, unspecified: Secondary | ICD-10-CM | POA: Diagnosis present

## 2019-03-26 DIAGNOSIS — G4730 Sleep apnea, unspecified: Secondary | ICD-10-CM | POA: Diagnosis not present

## 2019-03-26 DIAGNOSIS — M1710 Unilateral primary osteoarthritis, unspecified knee: Secondary | ICD-10-CM | POA: Diagnosis present

## 2019-03-26 DIAGNOSIS — Z79890 Hormone replacement therapy (postmenopausal): Secondary | ICD-10-CM | POA: Diagnosis not present

## 2019-03-26 DIAGNOSIS — G8918 Other acute postprocedural pain: Secondary | ICD-10-CM | POA: Diagnosis not present

## 2019-03-26 DIAGNOSIS — Z87891 Personal history of nicotine dependence: Secondary | ICD-10-CM | POA: Diagnosis not present

## 2019-03-26 HISTORY — PX: TOTAL KNEE ARTHROPLASTY: SHX125

## 2019-03-26 LAB — TYPE AND SCREEN
ABO/RH(D): A POS
Antibody Screen: NEGATIVE

## 2019-03-26 SURGERY — ARTHROPLASTY, KNEE, TOTAL
Anesthesia: Monitor Anesthesia Care | Site: Knee | Laterality: Left

## 2019-03-26 MED ORDER — CEFAZOLIN SODIUM-DEXTROSE 2-4 GM/100ML-% IV SOLN
INTRAVENOUS | Status: AC
  Administered 2019-03-26: 2 g via INTRAVENOUS
  Filled 2019-03-26: qty 100

## 2019-03-26 MED ORDER — MIDAZOLAM HCL 2 MG/2ML IJ SOLN
INTRAMUSCULAR | Status: DC | PRN
  Administered 2019-03-26 (×2): 1 mg via INTRAVENOUS

## 2019-03-26 MED ORDER — BSS IO SOLN
15.0000 mL | Freq: Once | INTRAOCULAR | Status: AC
  Administered 2019-03-26: 15 mL
  Filled 2019-03-26: qty 15

## 2019-03-26 MED ORDER — METHOCARBAMOL 500 MG IVPB - SIMPLE MED: 500.0000 mg | Freq: Four times a day (QID) | INTRAVENOUS | Status: DC | PRN

## 2019-03-26 MED ORDER — PROPOFOL 10 MG/ML IV BOLUS
INTRAVENOUS | Status: AC
  Filled 2019-03-26: qty 20

## 2019-03-26 MED ORDER — LACTATED RINGERS IV SOLN: INTRAVENOUS | Status: DC

## 2019-03-26 MED ORDER — LACTATED RINGERS IV BOLUS
250.0000 mL | Freq: Once | INTRAVENOUS | Status: AC
  Administered 2019-03-26: 250 mL via INTRAVENOUS

## 2019-03-26 MED ORDER — OXYCODONE HCL 5 MG/5ML PO SOLN: 5.0000 mg | Freq: Once | ORAL | Status: AC | PRN

## 2019-03-26 MED ORDER — BUPIVACAINE LIPOSOME 1.3 % IJ SUSP
20.0000 mL | Freq: Once | INTRAMUSCULAR | Status: DC
  Filled 2019-03-26: qty 20

## 2019-03-26 MED ORDER — RIVAROXABAN 10 MG PO TABS: 10.0000 mg | ORAL_TABLET | Freq: Every day | ORAL | 0 refills | Status: DC

## 2019-03-26 MED ORDER — TRANEXAMIC ACID-NACL 1000-0.7 MG/100ML-% IV SOLN
1000.0000 mg | INTRAVENOUS | Status: AC
  Administered 2019-03-26: 08:00:00 1000 mg via INTRAVENOUS
  Filled 2019-03-26: qty 100

## 2019-03-26 MED ORDER — ONDANSETRON HCL 4 MG/2ML IJ SOLN
INTRAMUSCULAR | Status: DC | PRN
  Administered 2019-03-26: 4 mg via INTRAVENOUS

## 2019-03-26 MED ORDER — METHOCARBAMOL 500 MG IVPB - SIMPLE MED
INTRAVENOUS | Status: AC
  Administered 2019-03-26: 500 mg via INTRAVENOUS
  Filled 2019-03-26: qty 50

## 2019-03-26 MED ORDER — ACETAMINOPHEN 10 MG/ML IV SOLN
1000.0000 mg | Freq: Once | INTRAVENOUS | Status: DC | PRN
  Administered 2019-03-26: 1000 mg via INTRAVENOUS

## 2019-03-26 MED ORDER — LACTATED RINGERS IV BOLUS: 500.0000 mL | Freq: Once | INTRAVENOUS | Status: DC

## 2019-03-26 MED ORDER — FENTANYL CITRATE (PF) 100 MCG/2ML IJ SOLN
INTRAMUSCULAR | Status: DC | PRN
  Administered 2019-03-26 (×2): 50 ug via INTRAVENOUS

## 2019-03-26 MED ORDER — SODIUM CHLORIDE (PF) 0.9 % IJ SOLN
INTRAMUSCULAR | Status: DC | PRN
  Administered 2019-03-26: 60 mL

## 2019-03-26 MED ORDER — MEPIVACAINE HCL (PF) 2 % IJ SOLN
INTRAMUSCULAR | Status: DC | PRN
  Administered 2019-03-26: 2.5 mL via INTRATHECAL

## 2019-03-26 MED ORDER — OXYCODONE HCL 5 MG PO TABS: 5.0000 mg | ORAL_TABLET | Freq: Four times a day (QID) | ORAL | 0 refills | Status: AC | PRN

## 2019-03-26 MED ORDER — OXYCODONE HCL 5 MG PO TABS
ORAL_TABLET | ORAL | Status: AC
  Administered 2019-03-26: 5 mg via ORAL
  Filled 2019-03-26: qty 1

## 2019-03-26 MED ORDER — ONDANSETRON HCL 4 MG/2ML IJ SOLN
INTRAMUSCULAR | Status: AC
  Filled 2019-03-26: qty 2

## 2019-03-26 MED ORDER — PHENYLEPHRINE HCL (PRESSORS) 10 MG/ML IV SOLN
INTRAVENOUS | Status: AC
  Filled 2019-03-26: qty 1

## 2019-03-26 MED ORDER — FENTANYL CITRATE (PF) 100 MCG/2ML IJ SOLN
INTRAMUSCULAR | Status: AC
  Administered 2019-03-26: 50 ug via INTRAVENOUS
  Filled 2019-03-26: qty 2

## 2019-03-26 MED ORDER — DEXAMETHASONE SODIUM PHOSPHATE 10 MG/ML IJ SOLN
INTRAMUSCULAR | Status: AC
  Filled 2019-03-26: qty 1

## 2019-03-26 MED ORDER — LIDOCAINE 2% (20 MG/ML) 5 ML SYRINGE
INTRAMUSCULAR | Status: DC | PRN
  Administered 2019-03-26: 40 mg via INTRAVENOUS

## 2019-03-26 MED ORDER — PROPOFOL 500 MG/50ML IV EMUL
INTRAVENOUS | Status: AC
  Filled 2019-03-26: qty 50

## 2019-03-26 MED ORDER — OXYCODONE HCL 5 MG PO TABS: 5.0000 mg | ORAL_TABLET | Freq: Once | ORAL | Status: AC | PRN

## 2019-03-26 MED ORDER — EPHEDRINE 5 MG/ML INJ
INTRAVENOUS | Status: AC
  Filled 2019-03-26: qty 10

## 2019-03-26 MED ORDER — MIDAZOLAM HCL 2 MG/2ML IJ SOLN
INTRAMUSCULAR | Status: AC
  Filled 2019-03-26: qty 2

## 2019-03-26 MED ORDER — FENTANYL CITRATE (PF) 100 MCG/2ML IJ SOLN: 25.0000 ug | INTRAMUSCULAR | Status: DC | PRN

## 2019-03-26 MED ORDER — ACETAMINOPHEN 160 MG/5ML PO SOLN: 1000.0000 mg | Freq: Once | ORAL | Status: DC | PRN

## 2019-03-26 MED ORDER — CEFAZOLIN SODIUM-DEXTROSE 2-4 GM/100ML-% IV SOLN: 2.0000 g | Freq: Four times a day (QID) | INTRAVENOUS | Status: DC

## 2019-03-26 MED ORDER — ROPIVACAINE HCL 7.5 MG/ML IJ SOLN
INTRAMUSCULAR | Status: DC | PRN
  Administered 2019-03-26: 20 mL via PERINEURAL

## 2019-03-26 MED ORDER — PROPOFOL 500 MG/50ML IV EMUL
INTRAVENOUS | Status: DC | PRN
  Administered 2019-03-26: 50 ug/kg/min via INTRAVENOUS

## 2019-03-26 MED ORDER — METHOCARBAMOL 500 MG PO TABS: 500.0000 mg | ORAL_TABLET | Freq: Four times a day (QID) | ORAL | 0 refills | Status: DC

## 2019-03-26 MED ORDER — EPHEDRINE SULFATE-NACL 50-0.9 MG/10ML-% IV SOSY
PREFILLED_SYRINGE | INTRAVENOUS | Status: DC | PRN
  Administered 2019-03-26: 5 mg via INTRAVENOUS

## 2019-03-26 MED ORDER — ACETAMINOPHEN 500 MG PO TABS: 1000.0000 mg | ORAL_TABLET | Freq: Once | ORAL | Status: DC | PRN

## 2019-03-26 MED ORDER — ACETAMINOPHEN 10 MG/ML IV SOLN
1000.0000 mg | Freq: Four times a day (QID) | INTRAVENOUS | Status: DC
  Administered 2019-03-26: 1000 mg via INTRAVENOUS
  Filled 2019-03-26: qty 100

## 2019-03-26 MED ORDER — FENTANYL CITRATE (PF) 100 MCG/2ML IJ SOLN
INTRAMUSCULAR | Status: AC
  Filled 2019-03-26: qty 2

## 2019-03-26 MED ORDER — LIDOCAINE 2% (20 MG/ML) 5 ML SYRINGE
INTRAMUSCULAR | Status: AC
  Filled 2019-03-26: qty 5

## 2019-03-26 MED ORDER — POVIDONE-IODINE 10 % EX SWAB
2.0000 "application " | Freq: Once | CUTANEOUS | Status: AC
  Administered 2019-03-26: 2 via TOPICAL

## 2019-03-26 MED ORDER — LACTATED RINGERS IV BOLUS: 250.0000 mL | Freq: Once | INTRAVENOUS | Status: DC

## 2019-03-26 MED ORDER — KETOROLAC TROMETHAMINE 0.5 % OP SOLN
1.0000 [drp] | Freq: Three times a day (TID) | OPHTHALMIC | Status: DC | PRN
  Administered 2019-03-26: 1 [drp] via OPHTHALMIC
  Filled 2019-03-26: qty 5

## 2019-03-26 MED ORDER — ACETAMINOPHEN 10 MG/ML IV SOLN
INTRAVENOUS | Status: AC
  Filled 2019-03-26: qty 100

## 2019-03-26 MED ORDER — OXYCODONE HCL 5 MG PO TABS: 5.0000 mg | ORAL_TABLET | Freq: Once | ORAL | Status: AC

## 2019-03-26 MED ORDER — CEFAZOLIN SODIUM-DEXTROSE 2-4 GM/100ML-% IV SOLN
2.0000 g | INTRAVENOUS | Status: AC
  Administered 2019-03-26: 07:00:00 2 g via INTRAVENOUS
  Filled 2019-03-26: qty 100

## 2019-03-26 MED ORDER — GABAPENTIN 300 MG PO CAPS: 300.0000 mg | ORAL_CAPSULE | Freq: Three times a day (TID) | ORAL | 0 refills | Status: DC

## 2019-03-26 MED ORDER — BUPIVACAINE LIPOSOME 1.3 % IJ SUSP
INTRAMUSCULAR | Status: DC | PRN
  Administered 2019-03-26: 20 mL

## 2019-03-26 MED ORDER — 0.9 % SODIUM CHLORIDE (POUR BTL) OPTIME
TOPICAL | Status: DC | PRN
  Administered 2019-03-26: 1000 mL

## 2019-03-26 MED ORDER — SODIUM CHLORIDE (PF) 0.9 % IJ SOLN
INTRAMUSCULAR | Status: AC
  Filled 2019-03-26: qty 10

## 2019-03-26 MED ORDER — SODIUM CHLORIDE (PF) 0.9 % IJ SOLN
INTRAMUSCULAR | Status: AC
  Filled 2019-03-26: qty 50

## 2019-03-26 MED ORDER — METHOCARBAMOL 500 MG PO TABS: 500.0000 mg | ORAL_TABLET | Freq: Four times a day (QID) | ORAL | Status: DC | PRN

## 2019-03-26 MED ORDER — DEXAMETHASONE SODIUM PHOSPHATE 10 MG/ML IJ SOLN
8.0000 mg | Freq: Once | INTRAMUSCULAR | Status: AC
  Administered 2019-03-26: 08:00:00 10 mg via INTRAVENOUS

## 2019-03-26 MED ORDER — STERILE WATER FOR IRRIGATION IR SOLN
Status: DC | PRN
  Administered 2019-03-26: 2000 mL

## 2019-03-26 MED ORDER — PROPOFOL 10 MG/ML IV BOLUS
INTRAVENOUS | Status: DC | PRN
  Administered 2019-03-26 (×2): 20 mg via INTRAVENOUS

## 2019-03-26 MED ORDER — SODIUM CHLORIDE 0.9 % IR SOLN
Status: DC | PRN
  Administered 2019-03-26: 1000 mL

## 2019-03-26 MED ORDER — CHLORHEXIDINE GLUCONATE 4 % EX LIQD: 60.0000 mL | Freq: Once | CUTANEOUS | Status: DC

## 2019-03-26 SURGICAL SUPPLY — 66 items
ATTUNE MED DOME PAT 41 KNEE (Knees) ×2 IMPLANT
ATTUNE MED DOME PAT 41MM KNEE (Knees) ×1 IMPLANT
ATTUNE PS FEM LT SZ 8 CEM KNEE (Femur) ×3 IMPLANT
ATTUNE PSRP INSR SZ8 8 KNEE (Insert) ×2 IMPLANT
ATTUNE PSRP INSR SZ8 8MM KNEE (Insert) ×1 IMPLANT
BAG ZIPLOCK 12X15 (MISCELLANEOUS) ×3 IMPLANT
BASE TIBIAL ROT PLAT SZ 8 KNEE (Knees) ×1 IMPLANT
BLADE SAG 18X100X1.27 (BLADE) ×3 IMPLANT
BLADE SAW SGTL 11.0X1.19X90.0M (BLADE) ×3 IMPLANT
BLADE SURG SZ10 CARB STEEL (BLADE) ×6 IMPLANT
BNDG ELASTIC 6X5.8 VLCR STR LF (GAUZE/BANDAGES/DRESSINGS) ×3 IMPLANT
BOWL SMART MIX CTS (DISPOSABLE) ×3 IMPLANT
CEMENT HV SMART SET (Cement) ×6 IMPLANT
CLOSURE STERI-STRIP 1/2X4 (GAUZE/BANDAGES/DRESSINGS) ×2
CLOSURE WOUND 1/2 X4 (GAUZE/BANDAGES/DRESSINGS) ×2
CLSR STERI-STRIP ANTIMIC 1/2X4 (GAUZE/BANDAGES/DRESSINGS) ×4 IMPLANT
COVER SURGICAL LIGHT HANDLE (MISCELLANEOUS) ×3 IMPLANT
COVER WAND RF STERILE (DRAPES) ×3 IMPLANT
CUFF TOURN SGL QUICK 34 (TOURNIQUET CUFF) ×2
CUFF TRNQT CYL 34X4.125X (TOURNIQUET CUFF) ×1 IMPLANT
DECANTER SPIKE VIAL GLASS SM (MISCELLANEOUS) ×3 IMPLANT
DRAPE U-SHAPE 47X51 STRL (DRAPES) ×3 IMPLANT
DRESSING AQUACEL AG SP 3.5X10 (GAUZE/BANDAGES/DRESSINGS) ×1 IMPLANT
DRSG ADAPTIC 3X8 NADH LF (GAUZE/BANDAGES/DRESSINGS) ×3 IMPLANT
DRSG AQUACEL AG ADV 3.5X10 (GAUZE/BANDAGES/DRESSINGS) ×3 IMPLANT
DRSG AQUACEL AG SP 3.5X10 (GAUZE/BANDAGES/DRESSINGS) ×3
DRSG PAD ABDOMINAL 8X10 ST (GAUZE/BANDAGES/DRESSINGS) ×3 IMPLANT
DURAPREP 26ML APPLICATOR (WOUND CARE) ×3 IMPLANT
ELECT REM PT RETURN 15FT ADLT (MISCELLANEOUS) ×3 IMPLANT
EVACUATOR 1/8 PVC DRAIN (DRAIN) ×3 IMPLANT
GAUZE SPONGE 4X4 12PLY STRL (GAUZE/BANDAGES/DRESSINGS) ×3 IMPLANT
GLOVE BIO SURGEON STRL SZ7 (GLOVE) ×3 IMPLANT
GLOVE BIO SURGEON STRL SZ8 (GLOVE) ×3 IMPLANT
GLOVE BIOGEL PI IND STRL 6.5 (GLOVE) ×1 IMPLANT
GLOVE BIOGEL PI IND STRL 7.0 (GLOVE) ×1 IMPLANT
GLOVE BIOGEL PI IND STRL 8 (GLOVE) ×1 IMPLANT
GLOVE BIOGEL PI INDICATOR 6.5 (GLOVE) ×2
GLOVE BIOGEL PI INDICATOR 7.0 (GLOVE) ×2
GLOVE BIOGEL PI INDICATOR 8 (GLOVE) ×2
GLOVE SURG SS PI 6.5 STRL IVOR (GLOVE) ×3 IMPLANT
GOWN STRL REUS W/TWL LRG LVL3 (GOWN DISPOSABLE) ×9 IMPLANT
HANDPIECE INTERPULSE COAX TIP (DISPOSABLE) ×2
HOLDER FOLEY CATH W/STRAP (MISCELLANEOUS) IMPLANT
IMMOBILIZER KNEE 20 (SOFTGOODS) ×3
IMMOBILIZER KNEE 20 THIGH 36 (SOFTGOODS) ×1 IMPLANT
KIT TURNOVER KIT A (KITS) IMPLANT
MANIFOLD NEPTUNE II (INSTRUMENTS) ×3 IMPLANT
NS IRRIG 1000ML POUR BTL (IV SOLUTION) ×3 IMPLANT
PACK TOTAL KNEE CUSTOM (KITS) ×3 IMPLANT
PADDING CAST COTTON 6X4 STRL (CAST SUPPLIES) ×6 IMPLANT
PENCIL SMOKE EVACUATOR (MISCELLANEOUS) IMPLANT
PIN STEINMAN FIXATION KNEE (PIN) ×3 IMPLANT
PIN THREADED HEADED SIGMA (PIN) ×3 IMPLANT
PROTECTOR NERVE ULNAR (MISCELLANEOUS) ×3 IMPLANT
SET HNDPC FAN SPRY TIP SCT (DISPOSABLE) ×1 IMPLANT
STRIP CLOSURE SKIN 1/2X4 (GAUZE/BANDAGES/DRESSINGS) ×4 IMPLANT
SUT MNCRL AB 4-0 PS2 18 (SUTURE) ×3 IMPLANT
SUT STRATAFIX 0 PDS 27 VIOLET (SUTURE) ×3
SUT VIC AB 2-0 CT1 27 (SUTURE) ×6
SUT VIC AB 2-0 CT1 TAPERPNT 27 (SUTURE) ×3 IMPLANT
SUTURE STRATFX 0 PDS 27 VIOLET (SUTURE) ×1 IMPLANT
TIBIAL BASE ROT PLAT SZ 8 KNEE (Knees) ×3 IMPLANT
TRAY FOLEY MTR SLVR 16FR STAT (SET/KITS/TRAYS/PACK) ×3 IMPLANT
WATER STERILE IRR 1000ML POUR (IV SOLUTION) ×6 IMPLANT
WRAP KNEE MAXI GEL POST OP (GAUZE/BANDAGES/DRESSINGS) ×3 IMPLANT
YANKAUER SUCT BULB TIP 10FT TU (MISCELLANEOUS) ×3 IMPLANT

## 2019-03-26 NOTE — Anesthesia Procedure Notes (Signed)
Anesthesia Regional Block: Adductor canal block   Pre-Anesthetic Checklist: ,, timeout performed, Correct Patient, Correct Site, Correct Laterality, Correct Procedure, Correct Position, site marked, Risks and benefits discussed,  Surgical consent,  Pre-op evaluation,  At surgeon's request and post-op pain management  Laterality: Left and Lower  Prep: chloraprep       Needles:  Injection technique: Single-shot     Needle Length: 9cm  Needle Gauge: 22     Additional Needles: Arrow StimuQuik ECHO Echogenic Stimulating PNB Needle  Procedures:,,,, ultrasound used (permanent image in chart),,,,  Narrative:  Start time: 03/26/2019 7:11 AM End time: 03/26/2019 7:15 AM Injection made incrementally with aspirations every 5 mL.  Performed by: Personally  Anesthesiologist: Oleta Mouse, MD

## 2019-03-26 NOTE — Discharge Instructions (Addendum)
 Dr. Frank Aluisio Total Joint Specialist [facility] [address] [place], [place] [address] [phone]  TOTAL KNEE REPLACEMENT POSTOPERATIVE DIRECTIONS  Knee Rehabilitation, Guidelines Following Surgery  Results after knee surgery are often greatly improved when you follow the exercise, range of motion and muscle strengthening exercises prescribed by your doctor. Safety measures are also important to protect the knee from further injury. Any time any of these exercises cause you to have increased pain or swelling in your knee joint, decrease the amount until you are comfortable again and slowly increase them. If you have problems or questions, call your caregiver or physical therapist for advice.   HOME CARE INSTRUCTIONS  . Remove items at home which could result in a fall. This includes throw rugs or furniture in walking pathways.   ICE to the affected knee every three hours for 30 minutes at a time and then as needed for pain and swelling.  Continue to use ice on the knee for pain and swelling from surgery. You may notice swelling that will progress down to the foot and ankle.  This is normal after surgery.  Elevate the leg when you are not up walking on it.    Continue to use the breathing machine which will help keep your temperature down.  It is common for your temperature to cycle up and down following surgery, especially at night when you are not up moving around and exerting yourself.  The breathing machine keeps your lungs expanded and your temperature down.  Do not place pillow under knee, focus on keeping the knee straight while resting  DIET You may resume your previous home diet once your are discharged from the hospital.  DRESSING / WOUND CARE / SHOWERING Remove ACE wrap and gauze on Wednesday (2 days following surgery). Leave the adhesive bandage underneath in place until Monday (1 week following surgery).  You may shower 3 days following surgery. Do  not submerge the incision under water.  ACTIVITY Discontinue the knee immobilizer when you can perform a straight leg raise.  Walk with your walker as instructed. Use walker as long as suggested by your caregivers. Avoid periods of inactivity such as sitting longer than an hour when not asleep. This helps prevent blood clots.  You may resume a sexual relationship in one month or when given the OK by your doctor.  You may return to work once you are cleared by your doctor.  Do not drive a car for 6 weeks or until released by you surgeon.  Do not drive while taking narcotics.  WEIGHT BEARING Weight bearing as tolerated with assist device (walker, cane, etc) as directed, use it as long as suggested by your surgeon or therapist, typically at least 4-6 weeks.  POSTOPERATIVE CONSTIPATION PROTOCOL Constipation - defined medically as fewer than three stools per week and severe constipation as less than one stool per week.  One of the most common issues patients have following surgery is constipation.  Even if you have a regular bowel pattern at home, your normal regimen is likely to be disrupted due to multiple reasons following surgery.  Combination of anesthesia, postoperative narcotics, change in appetite and fluid intake all can affect your bowels.  In order to avoid complications following surgery, here are some recommendations in order to help you during your recovery period.  Colace (docusate) - Pick up an over-the-counter form of Colace or another stool softener and take twice a day as long as you are requiring   postoperative pain medications.  Take with a full glass of water daily.  If you experience loose stools or diarrhea, hold the colace until you stool forms back up.  If your symptoms do not get better within 1 week or if they get worse, check with your doctor.  Dulcolax (bisacodyl) - Pick up over-the-counter and take as directed by the product packaging as needed to assist with the  movement of your bowels.  Take with a full glass of water.  Use this product as needed if not relieved by Colace only.   MiraLax (polyethylene glycol) - Pick up over-the-counter to have on hand.  MiraLax is a solution that will increase the amount of water in your bowels to assist with bowel movements.  Take as directed and can mix with a glass of water, juice, soda, coffee, or tea.  Take if you go more than two days without a movement. Do not use MiraLax more than once per day. Call your doctor if you are still constipated or irregular after using this medication for 7 days in a row.  If you continue to have problems with postoperative constipation, please contact the office for further assistance and recommendations.  If you experience "the worst abdominal pain ever" or develop nausea or vomiting, please contact the office immediatly for further recommendations for treatment.  ITCHING If you experience itching with your medications, try taking only a single pain pill, or even half a pain pill at a time.  You can also use Benadryl over the counter for itching or also to help with sleep.   TED HOSE STOCKINGS Wear the elastic stockings on both legs for three weeks following surgery during the day but you may remove then at night for sleeping.  MEDICATIONS See your medication summary on the "After Visit Summary" that the nursing staff will review with you prior to discharge.  You may have some home medications which will be placed on hold until you complete the course of blood thinner medication.  It is important for you to complete the blood thinner medication as prescribed by your surgeon.  Continue your approved medications as instructed at time of discharge.  PRECAUTIONS If you experience chest pain or shortness of breath - call 911 immediately for transfer to the hospital emergency department.  If you develop a fever greater that 101 F, purulent drainage from wound, increased redness or drainage  from wound, foul odor from the wound/dressing, or calf pain - CONTACT YOUR SURGEON.                                                   FOLLOW-UP APPOINTMENTS Make sure you keep all of your appointments after your operation with your surgeon and caregivers. You should call the office at the above phone number and make an appointment for approximately two weeks after the date of your surgery or on the date instructed by your surgeon outlined in the "After Visit Summary".  RANGE OF MOTION AND STRENGTHENING EXERCISES  Rehabilitation of the knee is important following a knee injury or an operation. After just a few days of immobilization, the muscles of the thigh which control the knee become weakened and shrink (atrophy). Knee exercises are designed to build up the tone and strength of the thigh muscles and to improve knee motion. Often times heat used for   twenty to thirty minutes before working out will loosen up your tissues and help with improving the range of motion but do not use heat for the first two weeks following surgery. These exercises can be done on a training (exercise) mat, on the floor, on a table or on a bed. Use what ever works the best and is most comfortable for you Knee exercises include:  . Leg Lifts - While your knee is still immobilized in a splint or cast, you can do straight leg raises. Lift the leg to 60 degrees, hold for 3 sec, and slowly lower the leg. Repeat 10-20 times 2-3 times daily. Perform this exercise against resistance later as your knee gets better.  . Quad and Hamstring Sets - Tighten up the muscle on the front of the thigh (Quad) and hold for 5-10 sec. Repeat this 10-20 times hourly. Hamstring sets are done by pushing the foot backward against an object and holding for 5-10 sec. Repeat as with quad sets.   Leg Slides: Lying on your back, slowly slide your foot toward your buttocks, bending your knee up off the floor (only go as far as is comfortable). Then slowly slide  your foot back down until your leg is flat on the floor again.  Angel Wings: Lying on your back spread your legs to the side as far apart as you can without causing discomfort.  A rehabilitation program following serious knee injuries can speed recovery and prevent re-injury in the future due to weakened muscles. Contact your doctor or a physical therapist for more information on knee rehabilitation.   IF YOU ARE TRANSFERRED TO A SKILLED REHAB FACILITY If the patient is transferred to a skilled rehab facility following release from the hospital, a list of the current medications will be sent to the facility for the patient to continue.  When discharged from the skilled rehab facility, please have the facility set up the patient's Home Health Physical Therapy prior to being released. Also, the skilled facility will be responsible for providing the patient with their medications at time of release from the facility to include their pain medication, the muscle relaxants, and their blood thinner medication. If the patient is still at the rehab facility at time of the two week follow up appointment, the skilled rehab facility will also need to assist the patient in arranging follow up appointment in our office and any transportation needs.  MAKE SURE YOU:  . Understand these instructions.  . Get help right away if you are not doing well or get worse.    Pick up stool softner and laxative for home use following surgery while on pain medications. Do not submerge incision under water. Please use good hand washing techniques while changing dressing each day. May shower starting three days after surgery. Please use a clean towel to pat the incision dry following showers. Continue to use ice for pain and swelling after surgery. Do not use any lotions or creams on the incision until instructed by your surgeon. 

## 2019-03-26 NOTE — Anesthesia Procedure Notes (Signed)
Procedure Name: MAC Date/Time: 03/26/2019 7:24 AM Performed by: Eben Burow, CRNA Pre-anesthesia Checklist: Patient identified, Emergency Drugs available, Suction available, Patient being monitored and Timeout performed Oxygen Delivery Method: Simple face mask Dental Injury: Teeth and Oropharynx as per pre-operative assessment

## 2019-03-26 NOTE — Anesthesia Procedure Notes (Signed)
Spinal  Patient location during procedure: OR Start time: 03/26/2019 7:15 AM End time: 03/26/2019 7:22 AM Staffing Performed: anesthesiologist  Anesthesiologist: Oleta Mouse, MD Preanesthetic Checklist Completed: patient identified, IV checked, risks and benefits discussed, surgical consent, monitors and equipment checked, pre-op evaluation and timeout performed Spinal Block Patient position: sitting Prep: DuraPrep Patient monitoring: heart rate, cardiac monitor, continuous pulse ox and blood pressure Approach: midline Location: L4-5 Injection technique: single-shot Needle Needle type: Pencan  Needle gauge: 24 G Needle length: 9 cm Assessment Sensory level: T6

## 2019-03-26 NOTE — Op Note (Signed)
OPERATIVE REPORT-TOTAL KNEE ARTHROPLASTY   Pre-operative diagnosis- Osteoarthritis  Left knee(s)  Post-operative diagnosis- Osteoarthritis Left knee(s)  Procedure-  Left  Total Knee Arthroplasty (Depuy Attune)  Surgeon- Gus Rankin. Caitlin Hillmer, MD  Assistant- Arther Abbott, PA-C   Anesthesia-  Adductor canal block and spinal  EBL-50 mL   Drains Hemovac  Tourniquet time-  Total Tourniquet Time Documented: Thigh (Left) - 51 minutes Total: Thigh (Left) - 51 minutes     Complications- None  Condition-PACU - hemodynamically stable.   Brief Clinical Note   Cameron Jackson is a 68 y.o. year old male with end stage OA of his left knee with progressively worsening pain and dysfunction. He has constant pain, with activity and at rest and significant functional deficits with difficulties even with ADLs. He has had extensive non-op management including analgesics, injections of cortisone and viscosupplements, and home exercise program, but remains in significant pain with significant dysfunction. Radiographs show bone on bone arthritis all 3 compartments. He presents now for left Total Knee Arthroplasty.    Procedure in detail---   The patient is brought into the operating room and positioned supine on the operating table. After successful administration of  Adductor canal block and spinal,   a tourniquet is placed high on the  Left thigh(s) and the lower extremity is prepped and draped in the usual sterile fashion. Time out is performed by the operating team and then the  Left lower extremity is wrapped in Esmarch, knee flexed and the tourniquet inflated to 300 mmHg.       A midline incision is made with a ten blade through the subcutaneous tissue to the level of the extensor mechanism. A fresh blade is used to make a medial parapatellar arthrotomy. Soft tissue over the proximal medial tibia is subperiosteally elevated to the joint line with a knife and into the semimembranosus bursa with a  Cobb elevator. Soft tissue over the proximal lateral tibia is elevated with attention being paid to avoiding the patellar tendon on the tibial tubercle. The patella is everted, knee flexed 90 degrees and the ACL and PCL are removed. Findings are bone on bone all 3 compartments with massive global osteophytes        The drill is used to create a starting hole in the distal femur and the canal is thoroughly irrigated with sterile saline to remove the fatty contents. The 5 degree Left  valgus alignment guide is placed into the femoral canal and the distal femoral cutting block is pinned to remove 9 mm off the distal femur. Resection is made with an oscillating saw.      The tibia is subluxed forward and the menisci are removed. The extramedullary alignment guide is placed referencing proximally at the medial aspect of the tibial tubercle and distally along the second metatarsal axis and tibial crest. The block is pinned to remove 26mm off the more deficient lateral  side. Resection is made with an oscillating saw. Size 8is the most appropriate size for the tibia and the proximal tibia is prepared with the modular drill and keel punch for that size.      The femoral sizing guide is placed and size 8 is most appropriate. Rotation is marked off the epicondylar axis and confirmed by creating a rectangular flexion gap at 90 degrees. The size 8 cutting block is pinned in this rotation and the anterior, posterior and chamfer cuts are made with the oscillating saw. The intercondylar block is then placed and that cut  is made.      Trial size 8 tibial component, trial size 8 posterior stabilized femur and a 8  mm posterior stabilized rotating platform insert trial is placed. Full extension is achieved with excellent varus/valgus and anterior/posterior balance throughout full range of motion. The patella is everted and thickness measured to be 24  mm. Free hand resection is taken to 12 mm, a 41 template is placed, lug holes  are drilled, trial patella is placed, and it tracks normally. Osteophytes are removed off the posterior femur with the trial in place. All trials are removed and the cut bone surfaces prepared with pulsatile lavage. Cement is mixed and once ready for implantation, the size 8 tibial implant, size  8 posterior stabilized femoral component, and the size 41 patella are cemented in place and the patella is held with the clamp. The trial insert is placed and the knee held in full extension. The Exparel (20 ml mixed with 60 ml saline) is injected into the extensor mechanism, posterior capsule, medial and lateral gutters and subcutaneous tissues.  All extruded cement is removed and once the cement is hard the permanent 8 mm posterior stabilized rotating platform insert is placed into the tibial tray.      The wound is copiously irrigated with saline solution and the extensor mechanism closed over a hemovac drain with #1 V-loc suture. The tourniquet is released for a total tourniquet time of 51  minutes. Flexion against gravity is 140 degrees and the patella tracks normally. Subcutaneous tissue is closed with 2.0 vicryl and subcuticular with running 4.0 Monocryl. The incision is cleaned and dried and steri-strips and a bulky sterile dressing are applied. The limb is placed into a knee immobilizer and the patient is awakened and transported to recovery in stable condition.      Please note that a surgical assistant was a medical necessity for this procedure in order to perform it in a safe and expeditious manner. Surgical assistant was necessary to retract the ligaments and vital neurovascular structures to prevent injury to them and also necessary for proper positioning of the limb to allow for anatomic placement of the prosthesis.   Cameron Plover Teya Otterson, MD    03/26/2019, 8:40 AM

## 2019-03-26 NOTE — Evaluation (Signed)
Physical Therapy Evaluation Patient Details Name: Cameron Jackson MRN: [id number] DOB: [birth year] Today's Date: 03/26/2019   History of Present Illness  Patient is 68 y.o. male s/p Lt TKA on 03/26/19 with PMH significant for stroke, HLD, HTN, OA, and ACDF.  Clinical Impression  Cameron Jackson is a 68 y.o. male POD 0 s/p Lt TKA. Patient reports independence with mobility at baseline. Patient is now limited by functional impairments (see PT problem list below) and requires  for transfers and gait with RW. Patient was able to ambulate ~130 feet with RW and min guard and cues for safe walker management. Patient educated on safe sequencing for stair mobility and verbalized safe guarding position for people assisting with mobility. Patient instructed in exercises to facilitate ROM and circulation. Patient will benefit from continued skilled PT interventions to address impairments and progress towards PLOF. Patient has met mobility goals at adequate level for discharge home; will continue to follow if pt continues acute stay to progress towards Mod I goals.     Follow Up Recommendations Follow surgeon's recommendation for DC plan and follow-up therapies    Equipment Recommendations  None recommended by PT    Recommendations for Other Services       Precautions / Restrictions Precautions Precautions: Fall Restrictions Weight Bearing Restrictions: No      Mobility  Bed Mobility Overal bed mobility: Needs Assistance Bed Mobility: Supine to Sit     Supine to sit: Supervision;HOB elevated     General bed mobility comments: no cues or assist required to sit up to EOB  Transfers Overall transfer level: Needs assistance Equipment used: Rolling walker (2 wheeled) Transfers: Sit to/from Stand Sit to Stand: Min guard;Supervision         General transfer comment: cues for safe hand placement and tehcnique with RW, no overt LOB and pt remained steady  Ambulation/Gait Ambulation/Gait  assistance: Min guard;Supervision Gait Distance (Feet): 130 Feet Assistive device: Rolling walker (2 wheeled) Gait Pattern/deviations: Step-through pattern;Decreased stride length;Decreased stance time - left;Decreased step length - right Gait velocity: decreased   General Gait Details: cues for safe step pattern and to maintain safe proximity to RW and keep walker on ground, no overt LOB noted. Pt with some buckling of Lt LE in stance phase but able to unweight with UE support on RW to prevent buckle.  Stairs Stairs: Yes Stairs assistance: Min guard Stair Management: Two rails;Forwards;Step to pattern Number of Stairs: 6(2x 3) General stair comments: verbal cues for safe step sequencing for "up with good, down with bad". pt verbalized safe understanidng of guarding for person(s) assisting him to get home.  Wheelchair Mobility    Modified Rankin (Stroke Patients Only)       Balance Overall balance assessment: Needs assistance Sitting-balance support: Feet supported Sitting balance-Leahy Scale: Good     Standing balance support: During functional activity;Bilateral upper extremity supported Standing balance-Leahy Scale: Fair              Pertinent Vitals/Pain Pain Assessment: 0-10 Pain Score: 3  Pain Location: Lt knee Pain Descriptors / Indicators: Aching;Sore;Tightness Pain Intervention(s): Limited activity within patient's tolerance;Monitored during session    [place] expects to be discharged to:: Private residence Living Arrangements: Spouse/significant other Available Help at Discharge: Family;Available 24 hours/day(pt's wife took 2 weeks off and son in [place] and another son in [place]) Type of Home: House Home Access: Stairs to enter Entrance Stairs-Rails: Right;Left;Can reach both Technical brewer of Steps: 6 Home Layout: One level Home  Equipment: Gilford Rile - 2 wheels      Prior Function Level of Independence: Independent                Hand Dominance   Dominant Hand: Right    Extremity/Trunk Assessment   Upper Extremity Assessment Upper Extremity Assessment: Overall WFL for tasks assessed    Lower Extremity Assessment Lower Extremity Assessment: LLE deficits/detail LLE Deficits / Details: pt with good quad activation in supine with no extensor lag with SLR; pt with some buckling noted in standing able to [facility] himself with UE support on RW LLE Sensation: WNL LLE Coordination: WNL    Cervical / Trunk Assessment Cervical / Trunk Assessment: Normal  Communication   Communication: No difficulties  Cognition Arousal/Alertness: Awake/alert Behavior During Therapy: WFL for tasks assessed/performed Overall Cognitive Status: Within Functional Limits for tasks assessed       General Comments      Exercises Total Joint Exercises Ankle Circles/Pumps: AROM;10 reps;Seated;Both;Supine Quad Sets: AROM;10 reps;Supine;Left Short Arc Quad: AROM;5 reps;Supine;Left Heel Slides: 10 reps;AAROM;Supine;Left Hip ABduction/ADduction: Supine;Left;5 reps;AROM Straight Leg Raises: AROM;5 reps;Supine;Left Other Exercises Other Exercises: demonstrated seated exercises for pt and provided handout: LAQ, knee bend, and assisted knee bend   Assessment/Plan    PT Assessment Patient needs continued PT services  PT Problem List Decreased strength;Decreased balance;Decreased activity tolerance;Decreased range of motion;Decreased mobility;Decreased knowledge of use of DME       PT Treatment Interventions DME instruction;Functional mobility training;Balance training;Patient/family education;Gait training;Therapeutic activities;Therapeutic exercise;Stair training    PT Goals (Current goals can be found in the Care Plan section)  Acute Rehab PT Goals Patient Stated Goal: to go home PT Goal Formulation: With patient Time For Goal Achievement: 04/02/19 Potential to Achieve Goals: Good    Frequency 7X/week     AM-PAC PT "6 Clicks" Mobility  Outcome Measure Help needed turning from your back to your side while in a flat bed without using bedrails?: None Help needed moving from lying on your back to sitting on the side of a flat bed without using bedrails?: A Little Help needed moving to and from a bed to a chair (including a wheelchair)?: A Little Help needed standing up from a chair using your arms (e.g., wheelchair or bedside chair)?: A Little Help needed to walk in hospital room?: A Little Help needed climbing 3-5 steps with a railing? : A Little 6 Click Score: 19    End of Session Equipment Utilized During Treatment: Gait belt Activity Tolerance: Patient tolerated treatment well Patient left: with call bell/phone within reach;in bed Nurse Communication: Mobility status PT Visit Diagnosis: Muscle weakness (generalized) (M62.81);Difficulty in walking, not elsewhere classified (R26.2)    Time: 2241-1464 PT Time Calculation (min) (ACUTE ONLY): 38 min   Charges:   PT Evaluation $PT Eval Low Complexity: 1 Low PT Treatments $Gait Training: 8-22 mins $Therapeutic Exercise: 8-22 mins        Gwynneth Albright PT, DPT Physical Therapist with [facility]  03/26/2019 5:05 PM

## 2019-03-26 NOTE — Interval H&P Note (Signed)
History and Physical Interval Note:  03/26/2019 6:30 AM  Cameron Jackson  has presented today for surgery, with the diagnosis of left knee osteoarthritis.  The various methods of treatment have been discussed with the patient and family. After consideration of risks, benefits and other options for treatment, the patient has consented to  Procedure(s) with comments: TOTAL KNEE ARTHROPLASTY (Left) - 2min as a surgical intervention.  The patient's history has been reviewed, patient examined, no change in status, stable for surgery.  I have reviewed the patient's chart and labs.  Questions were answered to the patient's satisfaction.     Pilar Plate Toneisha Savary

## 2019-03-26 NOTE — Progress Notes (Signed)
Pt discharged in NAD, VSS, pain tolerable. Pt and wife given discharge instructions. All questions answered. Pt's right eye feeling better. Pt discharged with walker for home use. Pt discharged by wheelchair home with wife.

## 2019-03-26 NOTE — Transfer of Care (Signed)
Immediate Anesthesia Transfer of Care Note  Patient: Cameron Jackson  Procedure(s) Performed: TOTAL KNEE ARTHROPLASTY (Left Knee)  Patient Location: PACU  Anesthesia Type:Spinal  Level of Consciousness: awake, alert  and oriented  Airway & Oxygen Therapy: Patient Spontanous Breathing and Patient connected to face mask oxygen  Post-op Assessment: Report given to RN and Post -op Vital signs reviewed and stable  Post vital signs: Reviewed and stable  Last Vitals:  Vitals Value Taken Time  BP    Temp    Pulse 64 03/26/19 0915  Resp 10 03/26/19 0915  SpO2 100 % 03/26/19 0915  Vitals shown include unvalidated device data.  Last Pain:  Vitals:   03/26/19 0545  TempSrc: Oral         Complications: No apparent anesthesia complications

## 2019-03-27 ENCOUNTER — Encounter: Payer: Self-pay | Admitting: *Deleted

## 2019-03-27 ENCOUNTER — Emergency Department (HOSPITAL_COMMUNITY): Payer: PPO

## 2019-03-27 ENCOUNTER — Emergency Department (HOSPITAL_COMMUNITY)
Admission: EM | Admit: 2019-03-27 | Discharge: 2019-03-27 | Disposition: A | Discharge status: Home / Self Care | Payer: PPO | Attending: Emergency Medicine | Admitting: Emergency Medicine

## 2019-03-27 ENCOUNTER — Other Ambulatory Visit: Payer: Self-pay

## 2019-03-27 DIAGNOSIS — Z79899 Other long term (current) drug therapy: Secondary | ICD-10-CM | POA: Insufficient documentation

## 2019-03-27 DIAGNOSIS — I10 Essential (primary) hypertension: Secondary | ICD-10-CM | POA: Diagnosis not present

## 2019-03-27 DIAGNOSIS — Z8673 Personal history of transient ischemic attack (TIA), and cerebral infarction without residual deficits: Secondary | ICD-10-CM | POA: Insufficient documentation

## 2019-03-27 DIAGNOSIS — R55 Syncope and collapse: Secondary | ICD-10-CM | POA: Diagnosis not present

## 2019-03-27 DIAGNOSIS — R52 Pain, unspecified: Secondary | ICD-10-CM | POA: Diagnosis not present

## 2019-03-27 DIAGNOSIS — Z87891 Personal history of nicotine dependence: Secondary | ICD-10-CM | POA: Diagnosis not present

## 2019-03-27 DIAGNOSIS — Z7901 Long term (current) use of anticoagulants: Secondary | ICD-10-CM | POA: Diagnosis not present

## 2019-03-27 DIAGNOSIS — M25562 Pain in left knee: Secondary | ICD-10-CM | POA: Diagnosis not present

## 2019-03-27 DIAGNOSIS — E039 Hypothyroidism, unspecified: Secondary | ICD-10-CM | POA: Insufficient documentation

## 2019-03-27 DIAGNOSIS — R4020 Unspecified coma: Secondary | ICD-10-CM | POA: Diagnosis not present

## 2019-03-27 DIAGNOSIS — R42 Dizziness and giddiness: Secondary | ICD-10-CM | POA: Diagnosis not present

## 2019-03-27 LAB — BASIC METABOLIC PANEL
Anion gap: 11 (ref 5–15)
BUN: 12 mg/dL (ref 8–23)
CO2: 28 mmol/L (ref 22–32)
Calcium: 8.6 mg/dL — ABNORMAL LOW (ref 8.9–10.3)
Chloride: 99 mmol/L (ref 98–111)
Creatinine, Ser: 0.94 mg/dL (ref 0.61–1.24)
GFR calc Af Amer: >60 mL/min (ref >60)
GFR calc non Af Amer: >60 mL/min (ref >60)
Glucose, Bld: 124 mg/dL — ABNORMAL HIGH (ref 70–99)
Potassium: 3.7 mmol/L (ref 3.5–5.1)
Sodium: 138 mmol/L (ref 135–145)

## 2019-03-27 LAB — CBC WITH DIFFERENTIAL/PLATELET
Abs Immature Granulocytes: 0.04 10*3/uL (ref 0.00–0.07)
Basophils Absolute: 0 10*3/uL (ref 0.0–0.1)
Basophils Relative: 0 %
Eosinophils Absolute: 0 10*3/uL (ref 0.0–0.5)
Eosinophils Relative: 0 %
HCT: 40.8 % (ref 39.0–52.0)
Hemoglobin: 13.6 g/dL (ref 13.0–17.0)
Immature Granulocytes: 0 %
Lymphocytes Relative: 12 %
Lymphs Abs: 1.2 10*3/uL (ref 0.7–4.0)
MCH: 33.6 pg (ref 26.0–34.0)
MCHC: 33.3 g/dL (ref 30.0–36.0)
MCV: 100.7 fL — ABNORMAL HIGH (ref 80.0–100.0)
Monocytes Absolute: 1.2 10*3/uL — ABNORMAL HIGH (ref 0.1–1.0)
Monocytes Relative: 11 %
Neutro Abs: 8.2 10*3/uL — ABNORMAL HIGH (ref 1.7–7.7)
Neutrophils Relative %: 77 %
Platelets: 202 10*3/uL (ref 150–400)
RBC: 4.05 MIL/uL — ABNORMAL LOW (ref 4.22–5.81)
RDW: 11.7 % (ref 11.5–15.5)
WBC: 10.7 10*3/uL — ABNORMAL HIGH (ref 4.0–10.5)
nRBC: 0 % (ref 0.0–0.2)

## 2019-03-27 LAB — URINALYSIS, ROUTINE W REFLEX MICROSCOPIC
Bilirubin Urine: NEGATIVE
Glucose, UA: NEGATIVE mg/dL
Hgb urine dipstick: NEGATIVE
Ketones, ur: NEGATIVE mg/dL
Leukocytes,Ua: NEGATIVE
Nitrite: NEGATIVE
Protein, ur: NEGATIVE mg/dL
Specific Gravity, Urine: 1.01 (ref 1.005–1.030)
pH: 6 (ref 5.0–8.0)

## 2019-03-27 MED ORDER — SODIUM CHLORIDE 0.9 % IV BOLUS
500.0000 mL | Freq: Once | INTRAVENOUS | Status: AC
  Administered 2019-03-27: 500 mL via INTRAVENOUS

## 2019-03-27 MED ORDER — SODIUM CHLORIDE (PF) 0.9 % IJ SOLN
INTRAMUSCULAR | Status: AC
  Filled 2019-03-27: qty 50

## 2019-03-27 MED ORDER — IOHEXOL 350 MG/ML SOLN
100.0000 mL | Freq: Once | INTRAVENOUS | Status: AC | PRN
  Administered 2019-03-27: 100 mL via INTRAVENOUS

## 2019-03-27 MED ORDER — MORPHINE SULFATE (PF) 4 MG/ML IV SOLN
4.0000 mg | Freq: Once | INTRAVENOUS | Status: AC
  Administered 2019-03-27: 4 mg via INTRAVENOUS
  Filled 2019-03-27: qty 1

## 2019-03-27 NOTE — Discharge Instructions (Addendum)
You were seen in the emergency department for evaluation after 2 fainting episodes today.  You had blood work EKG urinalysis and a CAT scan of your chest that did not show any serious findings.  This may be related to your knee pain after your operation or possibly some dehydration.  Please try to stay well-hydrated and be careful that the pain medicine may make you lightheaded also.  Call your doctor tomorrow for close follow-up.  Return to the emergency department if any worsening symptoms.

## 2019-03-27 NOTE — ED Triage Notes (Signed)
Pt arrives [facility] from home with complaints of a near syncopal episode today. Pt denies falling. Pt reports that he was able to lower himself to the floor. Pt reports he had a left knee replacement surgery yesterday. Pt denies chest pain or SHOB. Pt administered 8mg  Morphine IV for left knee pain.

## 2019-03-27 NOTE — ED Notes (Signed)
Pt did not express or show any signs of distress or pain during orthostatics

## 2019-03-27 NOTE — ED Provider Notes (Signed)
[facility] DEPT Provider Note   CSN: [id number] Arrival date & time: 03/27/19  5573     History Chief Complaint  Patient presents with  . Loss of Consciousness    Cameron Jackson is a 68 y.o. male.  He is presenting by EMS after a syncopal event at home.  He had knee surgery yesterday by Dr. Reynaldo Minium.  He was given a prescription of rivaroxaban to start but has not begun it yet.  Around 10 AM today he said he was urinating when he felt acutely lightheaded.  The sensation passed but later today he felt lightheaded again and his wife helped lower him to the floor.  She told EMS that he was out of it for a few seconds.  Currently he denies any complaints other than pain in his knee which is improved after EMS gave him some morphine.  No prior history of syncope.  No chest pain or shortness of breath.  He said his pain has not been well controlled so far.  The history is provided by the patient and the EMS personnel.  Loss of Consciousness Episode history:  Multiple Most recent episode:  Today Progression:  Resolved Chronicity:  New Context: standing up and urination   Witnessed: yes   Relieved by:  Lying down Worsened by:  Nothing Ineffective treatments:  None tried Associated symptoms: nausea and recent surgery   Associated symptoms: no chest pain, no difficulty breathing, no fever, no focal sensory loss, no focal weakness, no headaches, no shortness of breath and no vomiting        Past Medical History:  Diagnosis Date  . Arthritis    Osteo Arthritis  . Complication of anesthesia    hypothermia- 20 years ago.  Aggessive behavior - with `endo' years ago.  Marland Kitchen Hyperlipidemia   . Hypertension    not on medication now. 140/80s is high for patient.  . Hypothyroidism   . Mental disorder   . Sleep apnea    CPAP  . Stroke [facility])    "mini" stroke per MRI    Patient Active Problem List   Diagnosis Date Noted  . OA (osteoarthritis) of knee 03/26/2019    . Osteoarthritis of left knee 03/26/2019  . Cervical stenosis of spinal canal 02/28/2013  . Dyspnea 07/15/2010    Past Surgical History:  Procedure Laterality Date  . ANTERIOR CERVICAL DECOMPRESSION/DISCECTOMY FUSION 4 LEVELS N/A 02/28/2013   Procedure: ANTERIOR CERVICAL DECOMPRESSION/DISCECTOMY FUSION 4 LEVELS;  Surgeon: Floyce Stakes, MD;  Location: [facility];  Service: Neurosurgery;  Laterality: N/A;  C3-4 C4-5 C5-6 C6-7 Anterior cervical decompression/diskectomy/fusion  . COLONOSCOPY    . KNEE SURGERY  [id number]   left  . TONSILLECTOMY    . TOTAL KNEE ARTHROPLASTY Left 03/26/2019   Procedure: TOTAL KNEE ARTHROPLASTY;  Surgeon: Gaynelle Arabian, MD;  Location: [facility];  Service: Orthopedics;  Laterality: Left;  60min       Family History  Problem Relation Age of Onset  . Allergies Father   . Heart disease Father   . Allergies Brother   . Heart disease Mother     Social History   Tobacco Use  . Smoking status: Former Smoker    Packs/day: 2.00    Years: 11.00    Pack years: 22.00    Types: Cigarettes    Quit date: 04/05/1981    Years since quitting: 38.0  . Smokeless tobacco: Former Systems developer    Types: Chew    Quit date:  04/05/2004  Substance Use Topics  . Alcohol use: No    Comment: beer 6 days per wk.. quit  . Drug use: No    Home Medications Prior to Admission medications   Medication Sig Start Date End Date Taking? Authorizing Provider  atorvastatin (LIPITOR) 40 MG tablet Take 40 mg by mouth daily.    [provider]  cetirizine (ZYRTEC) 10 MG tablet Take 10 mg by mouth at bedtime.    [provider]  Cholecalciferol (VITAMIN D) 50 MCG (2000 UT) CAPS Take 6,000 Units by mouth daily.     [provider]  escitalopram (LEXAPRO) 10 MG tablet Take 10 mg by mouth 3 (three) times daily.     [provider]  gabapentin (NEURONTIN) 300 MG capsule Take 1 capsule (300 mg total) by mouth 3 (three) times daily. Then a 300 mg capsule  twice a day for two weeks, Then a 300 mg capsule once a day for two weeks, then discontinue the Gabapentin. 03/26/19 05/07/19  Celedonio Savage, Amber, PA-C  levothyroxine (SYNTHROID, LEVOTHROID) 125 MCG tablet Take 125 mcg by mouth daily before breakfast.    [provider]  methocarbamol (ROBAXIN) 500 MG tablet Take 1 tablet (500 mg total) by mouth 4 (four) times daily. 03/26/19   Constable, Amber, PA-C  oxyCODONE (ROXICODONE) 5 MG immediate release tablet Take 1-2 tablets (5-10 mg total) by mouth every 6 (six) hours as needed for up to 7 days. 03/26/19 04/02/19  Celedonio Savage, Amber, PA-C  rivaroxaban (XARELTO) 10 MG TABS tablet Take 1 tablet (10 mg total) by mouth daily. For 3 weeks then resume aspirin 81mg  daily. 03/26/19   03/28/19, Amber, PA-C  tamsulosin (FLOMAX) 0.4 MG CAPS capsule Take 0.4 mg by mouth daily.     [provider]  zolpidem (AMBIEN) 10 MG tablet Take 10 mg by mouth at bedtime.    [provider]    Allergies    Other  Review of Systems   Review of Systems  Constitutional: Negative for fever.  HENT: Negative for sore throat.   Eyes: Negative for visual disturbance.  Respiratory: Negative for shortness of breath.   Cardiovascular: Positive for syncope. Negative for chest pain.  Gastrointestinal: Positive for nausea. Negative for abdominal pain and vomiting.  Genitourinary: Negative for dysuria.  Musculoskeletal: Positive for gait problem and joint swelling.  Skin: Positive for wound. Negative for rash.  Neurological: Positive for syncope and light-headedness. Negative for focal weakness and headaches.    Physical Exam Updated Vital Signs BP 117/70   Pulse 62   Temp 100.3 F (37.9 C) (Oral)   Resp 19   Ht 6\' 4"  (1.93 m)   Wt 110.7 kg   SpO2 99%   BMI 29.70 kg/m   Physical Exam Vitals and nursing note reviewed.  Constitutional:      Appearance: Normal appearance. He is well-developed.  HENT:     Head: Normocephalic and atraumatic.  Eyes:      Conjunctiva/sclera: Conjunctivae normal.  Cardiovascular:     Rate and Rhythm: Normal rate and regular rhythm.     Heart sounds: No murmur.  Pulmonary:     Effort: Pulmonary effort is normal. No respiratory distress.     Breath sounds: Normal breath sounds.  Abdominal:     Palpations: Abdomen is soft.     Tenderness: There is no abdominal tenderness.  Musculoskeletal:        General: Tenderness (left knee) present.     Cervical back: Neck supple.  Comments: Left lower extremity in a knee immobilizer.  Distal motor and sensation intact.  Skin:    General: Skin is warm and dry.     Capillary Refill: Capillary refill takes less than 2 seconds.  Neurological:     General: No focal deficit present.     Mental Status: He is alert.     Sensory: No sensory deficit.     Motor: No weakness.     ED Results / Procedures / Treatments   Labs (all labs ordered are listed, but only abnormal results are displayed) Labs Reviewed  BASIC METABOLIC PANEL - Abnormal; Notable for the following components:      Result Value   Glucose, Bld 124 (*)    Calcium 8.6 (*)    All other components within normal limits  CBC WITH DIFFERENTIAL/PLATELET - Abnormal; Notable for the following components:   WBC 10.7 (*)    RBC 4.05 (*)    MCV 100.7 (*)    Neutro Abs 8.2 (*)    Monocytes Absolute 1.2 (*)    All other components within normal limits  URINALYSIS, ROUTINE W REFLEX MICROSCOPIC    EKG EKG Interpretation  Date/Time:  Tuesday March 27 2019 18:09:50 EST Ventricular Rate:  63 PR Interval:  140 QRS Duration: 94 QT Interval:  402 QTC Calculation: 411 R Axis:   46 Text Interpretation: Normal sinus rhythm Normal ECG similar to prior 12/20 Confirmed by Meridee Score [phone]) on 03/27/2019 6:18:41 PM   Radiology CT Angio Chest PE W/Cm &/Or Wo Cm  Result Date: 03/27/2019 CLINICAL DATA:  Syncope, recent orthopedic surgery EXAM: CT ANGIOGRAPHY CHEST WITH CONTRAST TECHNIQUE:  Multidetector CT imaging of the chest was performed using the standard protocol during bolus administration of intravenous contrast. Multiplanar CT image reconstructions and MIPs were obtained to evaluate the vascular anatomy. CONTRAST:  OMNIPAQUE IOHEXOL 350 MG/ML SOLN COMPARISON:  CTA 06/15/2010 FINDINGS: Cardiovascular: Evaluation the pulmonary arteries beyond the lobar level is limited due to extensive respiratory motion artifact. There is satisfactory opacification of the pulmonary arteries to the lobar branches. No central or lobar filling defects are identified on thin section or MIP reconstruction. Normal heart size. No pericardial effusion. Atherosclerotic plaque within the normal caliber aorta. Normal 3 vessel branching of the arch. Mediastinum/Nodes: No enlarged mediastinal, hilar, or axillary lymph nodes. Thyroid gland, trachea, and esophagus demonstrate no significant findings. Lungs/Pleura: No consolidation, features of edema, pneumothorax, or effusion. Atelectatic changes noted in the lung bases. More bandlike opacity may reflect atelectasis or scarring. No suspicious pulmonary nodules or masses though evaluation for subtle anomaly is limited given respiratory motion. Upper Abdomen: Fluid attenuation cyst seen in the anterior left lobe liver minimally increased in size from comparison. Additional fluid attenuation cyst seen in the anterior right lobe liver, also similar to slightly increased in size. No worrisome hepatic lesions. Stable 2.2 cm low-attenuation right adrenal nodule (4 HU), most compatible with a lipid rich adenoma. No acute abnormalities present in the visualized portions of the upper abdomen. Musculoskeletal: Multilevel degenerative changes are present in the imaged portions of the spine. Partial visualization of cervical fusion hardware. Review of the MIP images confirms the above findings. IMPRESSION: 1. Evaluation of the pulmonary arteries beyond the lobar level is limited due  to extensive respiratory motion artifact. No central or lobar pulmonary emboli are identified. 2. No acute findings to account for the patient's symptoms. 3. Stable right adrenal adenoma. 4. Similar to slightly increased size of the probable simple cysts in the  liver. 5.  Aortic Atherosclerosis (ICD10-I70.0). Electronically Signed   By: Kreg ShropshirePrice  DeHay M.D.   On: 03/27/2019 20:13    Procedures Procedures (including critical care time)  Medications Ordered in ED Medications  sodium chloride 0.9 % bolus 500 mL (0 mLs Intravenous Stopped 03/27/19 1911)  iohexol (OMNIPAQUE) 350 MG/ML injection 100 mL (100 mLs Intravenous Contrast Given 03/27/19 1950)  morphine 4 MG/ML injection 4 mg (4 mg Intravenous Given 03/27/19 2025)    ED Course  I have reviewed the triage vital signs and the nursing notes.  Pertinent labs & imaging results that were available during my care of the patient were reviewed by me and considered in my medical decision making (see chart for details).  Clinical Course as of Mar 27 1200  Tue Mar 27, 2019  [id number] 68 year old male postop from total knee surgery yesterday here with 1 episode of near syncope and 1 episode of syncope today at home.  Preceding symptoms were some lightheadedness.  Currently denies any chest pain or shortness of breath.  Sats 98% on room air.  Differential includes vasovagal syncope, dehydration, arrhythmia, PE.    [MB]  1822 Patient not tachycardic tachypneic or hypoxic currently.  Think that have PE large enough to make him have a syncopal event should still be symptomatic.  Low-grade temperature of 100.3.  No infectious symptoms.   [MB]  2042 His work-up here has been unremarkable.  I ended up doing a CT study for PE and that was negative.  He feels improved.  Possibly related to dehydration or potentially his pain level and poor sleep.  He is comfortable going home and I recommended to him that he contact his orthopedic doctor tomorrow regarding his pain  management.   [MB]  2051 Patient's orthostatics were good and he would like to go home.  He is calling his wife for a ride.   [MB]    Clinical Course User Index [MB] Terrilee FilesButler, Keawe Marcello C, MD   MDM Rules/Calculators/A&P                       Final Clinical Impression(s) / ED Diagnoses Final diagnoses:  Syncope and collapse  Acute pain of left knee    Rx / DC Orders ED Discharge Orders    None       Terrilee FilesButler, Vannah Nadal C, MD 03/28/19 1202

## 2019-03-27 NOTE — Anesthesia Postprocedure Evaluation (Addendum)
Anesthesia Post Note  Patient: Cameron Jackson  Procedure(s) Performed: TOTAL KNEE ARTHROPLASTY (Left Knee)     Patient location during evaluation: PACU Anesthesia Type: Regional Level of consciousness: awake and alert Pain management: pain level controlled Vital Signs Assessment: post-procedure vital signs reviewed and stable Respiratory status: spontaneous breathing, nonlabored ventilation, respiratory function stable and patient connected to nasal cannula oxygen Cardiovascular status: stable and blood pressure returned to baseline Postop Assessment: no apparent nausea or vomiting and spinal receding Anesthetic complications: no Comments: Patient complained of pain and grittiness of right eye in phase 2, Toradol and salin drops ordered and patient discharged, instructed to contact Dr Alusio's office is pain or vision worsen    Last Vitals:  Vitals:   03/26/19 1300 03/26/19 1320  BP: (!) 165/99 (!) 146/83  Pulse: (!) 54 61  Resp:    Temp:    SpO2: 98% 99%    Last Pain:  Vitals:   03/26/19 1320  TempSrc:   PainSc: 3                  Arkel Cartwright

## 2019-03-28 NOTE — Discharge Summary (Signed)
Physician Discharge Summary   Patient ID: Cameron Jackson MRN: [id number] DOB/AGE: 04-Nov-1950 68 y.o.  Admit date: 03/26/2019 Discharge date: 03/26/2019  Primary Diagnosis: Osteoarthritis, left knee   Admission Diagnoses:  Past Medical History:  Diagnosis Date  . Arthritis    Osteo Arthritis  . Complication of anesthesia    hypothermia- 20 years ago.  Aggessive behavior - with `endo' years ago.  Marland Kitchen Hyperlipidemia   . Hypertension    not on medication now. 140/80s is high for patient.  . Hypothyroidism   . Mental disorder   . Sleep apnea    CPAP  . Stroke [facility])    "mini" stroke per MRI   Discharge Diagnoses:   Principal Problem:   OA (osteoarthritis) of knee Active Problems:   Osteoarthritis of left knee  Estimated body mass index is 31.6 kg/m as calculated from the following:   Height as of this encounter:  (1.93 m).   Weight as of this encounter: 117.8 kg.  Procedure:  Procedure(s) (LRB): TOTAL KNEE ARTHROPLASTY (Left)   Consults: None  HPI: [name] is a 68 y.o. year old male with end stage OA of his left knee with progressively worsening pain and dysfunction. He has constant pain, with activity and at rest and significant functional deficits with difficulties even with ADLs. He has had extensive non-op management including analgesics, injections of cortisone and viscosupplements, and home exercise program, but remains in significant pain with significant dysfunction. Radiographs show bone on bone arthritis all 3 compartments. He presents now for left Total Knee Arthroplasty.    Laboratory Data: Hospital Outpatient Visit on 03/22/2019  Component Date Value Ref Range Status  . SARS-CoV-2, NAA 03/22/2019 NOT DETECTED  NOT DETECTED Final   Comment: (NOTE) This nucleic acid amplification test was developed and its performance characteristics determined by [employer]. Nucleic acid amplification tests include PCR and TMA. This test has not been  FDA cleared or approved. This test has been authorized by FDA under an Emergency Use Authorization (EUA). This test is only authorized for the duration of time the declaration that circumstances exist justifying the authorization of the emergency use of in vitro diagnostic tests for detection of SARS-CoV-2 virus and/or diagnosis of COVID-19 infection under section 564(b)(1) of the Act, 21 U.S.C. 409WJX-9(J) (1), unless the authorization is terminated or revoked sooner. When diagnostic testing is negative, the possibility of a false negative result should be considered in the context of a patient's recent exposures and the presence of clinical signs and symptoms consistent with COVID-19. An individual without symptoms of COVID- 19 and who is not shedding SARS-CoV-2 vi                          rus would expect to have a negative (not detected) result in this assay. Performed At: [facility] [address] [place], [place] [id number] Jolene Schimke MD HY:[id number]   . Coronavirus Source 03/22/2019 NASOPHARYNGEAL   Final   Performed at [facility], [address] [place], [place] [address]  Hospital Outpatient Visit on 03/22/2019  Component Date Value Ref Range Status  . MRSA, PCR 03/22/2019 NEGATIVE  NEGATIVE Final  . Staphylococcus aureus 03/22/2019 NEGATIVE  NEGATIVE Final   Comment: (NOTE) The Xpert SA Assay (FDA approved for NASAL specimens in patients 74 years of age and older), is one component of a comprehensive surveillance program. It is not intended to diagnose infection nor to guide or monitor  treatment. Performed at [facility], [address], [place], [place] [address]   . aPTT 03/22/2019 31  24 - 36 seconds Final   Performed at [facility], [address], [place], [place] [address]  . WBC 03/22/2019 5.0  4.0 - 10.5 K/uL Final  . RBC 03/22/2019 4.85  4.22 - 5.81 MIL/uL Final  . Hemoglobin 03/22/2019 16.3  13.0  - 17.0 g/dL Final  . HCT 91/47/8295 47.6  39.0 - 52.0 % Final  . MCV 03/22/2019 98.1  80.0 - 100.0 fL Final  . MCH 03/22/2019 33.6  26.0 - 34.0 pg Final  . MCHC 03/22/2019 34.2  30.0 - 36.0 g/dL Final  . RDW 62/13/0865 11.4* 11.5 - 15.5 % Final  . Platelets 03/22/2019 213  150 - 400 K/uL Final  . nRBC 03/22/2019 0.0  0.0 - 0.2 % Final   Performed at [facility], [address], [place], [place] [address]  . Sodium 03/22/2019 142  135 - 145 mmol/L Final  . Potassium 03/22/2019 4.2  3.5 - 5.1 mmol/L Final  . Chloride 03/22/2019 106  98 - 111 mmol/L Final  . CO2 03/22/2019 27  22 - 32 mmol/L Final  . Glucose, Bld 03/22/2019 108* 70 - 99 mg/dL Final  . BUN 62/95/2841 13  8 - 23 mg/dL Final  . Creatinine, Ser 03/22/2019 0.84  0.61 - 1.24 mg/dL Final  . Calcium 32/44/0102 9.3  8.9 - 10.3 mg/dL Final  . Total Protein 03/22/2019 7.7  6.5 - 8.1 g/dL Final  . Albumin 72/53/6644 4.8  3.5 - 5.0 g/dL Final  . AST 03/47/4259 28  15 - 41 U/L Final  . ALT 03/22/2019 41  0 - 44 U/L Final  . Alkaline Phosphatase 03/22/2019 66  38 - 126 U/L Final  . Total Bilirubin 03/22/2019 1.4* 0.3 - 1.2 mg/dL Final  . GFR calc non Af Amer 03/22/2019 >60  >60 mL/min Final  . GFR calc Af Amer 03/22/2019 >60  >60 mL/min Final  . Anion gap 03/22/2019 9  5 - 15 Final   Performed at [facility], [address] [place], [place] [address]  . Prothrombin Time 03/22/2019 12.1  11.4 - 15.2 seconds Final  . INR 03/22/2019 0.9  0.8 - 1.2 Final   Comment: (NOTE) INR goal varies based on device and disease states. Performed at [facility], [address], [place], [place] [address]   . ABO/RH(D) 03/22/2019 A POS   Final  . Antibody Screen 03/22/2019 NEG   Final  . Sample Expiration 03/22/2019 03/29/2019,2359   Final  . Extend sample reason 03/22/2019    Final                   Value:NO TRANSFUSIONS OR PREGNANCY IN THE PAST 3 MONTHS Performed at [facility], [address], [place], [place] [address]   . ABO/RH(D) 03/22/2019    Final                   Value:A POS Performed at [facility], [address], [place], [place] [address]      X-Rays:CT Angio Chest PE W/Cm &/Or Wo Cm  Result Date: 03/27/2019 CLINICAL DATA:  Syncope, recent orthopedic surgery EXAM: CT ANGIOGRAPHY CHEST WITH CONTRAST TECHNIQUE: Multidetector CT imaging of the chest was performed using the standard protocol during bolus administration of intravenous contrast. Multiplanar CT image reconstructions and MIPs were obtained  to evaluate the vascular anatomy. CONTRAST:  100mL OMNIPAQUE IOHEXOL 350 MG/ML SOLN COMPARISON:  CTA 06/15/2010 FINDINGS: Cardiovascular: Evaluation the pulmonary arteries beyond the lobar level is limited due to extensive respiratory motion artifact. There is satisfactory opacification of the pulmonary arteries to the lobar branches. No central or lobar filling defects are identified on thin section or MIP reconstruction. Normal heart size. No pericardial effusion. Atherosclerotic plaque within the normal caliber aorta. Normal 3 vessel branching of the arch. Mediastinum/Nodes: No enlarged mediastinal, hilar, or axillary lymph nodes. Thyroid gland, trachea, and esophagus demonstrate no significant findings. Lungs/Pleura: No consolidation, features of edema, pneumothorax, or effusion. Atelectatic changes noted in the lung bases. More bandlike opacity may reflect atelectasis or scarring. No suspicious pulmonary nodules or masses though evaluation for subtle anomaly is limited given respiratory motion. Upper Abdomen: Fluid attenuation cyst seen in the anterior left lobe liver minimally increased in size from comparison. Additional fluid attenuation cyst seen in the anterior right lobe liver, also similar to slightly increased in size. No worrisome hepatic lesions. Stable 2.2 cm low-attenuation right adrenal nodule (4 HU), most  compatible with a lipid rich adenoma. No acute abnormalities present in the visualized portions of the upper abdomen. Musculoskeletal: Multilevel degenerative changes are present in the imaged portions of the spine. Partial visualization of cervical fusion hardware. Review of the MIP images confirms the above findings. IMPRESSION: 1. Evaluation of the pulmonary arteries beyond the lobar level is limited due to extensive respiratory motion artifact. No central or lobar pulmonary emboli are identified. 2. No acute findings to account for the patient's symptoms. 3. Stable right adrenal adenoma. 4. Similar to slightly increased size of the probable simple cysts in the liver. 5.  Aortic Atherosclerosis (ICD10-I70.0). Electronically Signed   By: Kreg ShropshirePrice  DeHay M.D.   On: 03/27/2019 20:13    EKG: Orders placed or performed during the hospital encounter of 03/22/19  . EKG 12 lead  . EKG 12 lead     Hospital Course: Cameron Jackson is a 68 y.o. who [facility]. They were brought to the operating room on 03/26/2019 and underwent Procedure(s): TOTAL KNEE ARTHROPLASTY.  Patient tolerated the procedure well and was later transferred to the recovery room for postoperative care. They were given PO and IV analgesics for pain control following their surgery. They were given 24 hours of postoperative antibiotics of  Anti-infectives (From admission, onward)   Start     Dose/Rate Route Frequency Ordered Stop   03/26/19 1415  ceFAZolin (ANCEF) IVPB 2g/100 mL premix  Status:  Discontinued     2 g 200 mL/hr over 30 Minutes Intravenous Every 6 hours 03/26/19 1403 03/26/19 1833   03/26/19 0600  ceFAZolin (ANCEF) IVPB 2g/100 mL premix     2 g 200 mL/hr over 30 Minutes Intravenous On call to O.R. 03/26/19 0530 03/26/19 0728     and started on DVT prophylaxis in the form of Aspirin.   Physical therapy was ordered for total joint protocol. Patient received three normal saline boluses in recovery and  completed a session of physical therapy. Pain was well controlled with medications, patient was able to void on his own, and was meeting his goals wit therapy. Pt was discharged to home on a same day discharge in stable condition.  Diet: Regular diet Activity: WBAT Follow-up: in 2 weeks Disposition: Home Discharged Condition: stable   Discharge Instructions    Call MD / Call 911   Complete by: As directed  If you experience chest pain or shortness of breath, CALL 911 and be transported to the hospital emergency room.  If you develope a fever above 101 F, pus (white drainage) or increased drainage or redness at the wound, or calf pain, call your surgeon's office.   Change dressing   Complete by: As directed    Remove bulky dressing on Wednesday. Leave adhesive bandage in place until one week following surgery.   Constipation Prevention   Complete by: As directed    Drink plenty of fluids.  Prune juice may be helpful.  You may use a stool softener, such as Colace (over the counter) 100 mg twice a day.  Use MiraLax (over the counter) for constipation as needed.   Diet - low sodium heart healthy   Complete by: As directed    Do not put a pillow under the knee. Place it under the heel.   Complete by: As directed    Driving restrictions   Complete by: As directed    No driving for two weeks   TED hose   Complete by: As directed    Use stockings (TED hose) for three weeks on both leg(s).  You may remove them at night for sleeping.   Weight bearing as tolerated   Complete by: As directed      Allergies as of 03/26/2019      Reactions   Other Swelling   Bee Stings- swelling of face.      Medication List    STOP taking these medications   aspirin 325 MG tablet Commonly known as: Bayer Aspirin   meloxicam 15 MG tablet Commonly known as: MOBIC     TAKE these medications   atorvastatin 40 MG tablet Commonly known as: LIPITOR Take 40 mg by mouth daily.   cetirizine 10 MG  tablet Commonly known as: ZYRTEC Take 10 mg by mouth at bedtime.   escitalopram 10 MG tablet Commonly known as: LEXAPRO Take 10 mg by mouth 3 (three) times daily.   gabapentin 300 MG capsule Commonly known as: Neurontin Take 1 capsule (300 mg total) by mouth 3 (three) times daily. Then a 300 mg capsule twice a day for two weeks, Then a 300 mg capsule once a day for two weeks, then discontinue the Gabapentin.   levothyroxine 125 MCG tablet Commonly known as: SYNTHROID Take 125 mcg by mouth daily before breakfast.   methocarbamol 500 MG tablet Commonly known as: Robaxin Take 1 tablet (500 mg total) by mouth 4 (four) times daily.   oxyCODONE 5 MG immediate release tablet Commonly known as: Roxicodone Take 1-2 tablets (5-10 mg total) by mouth every 6 (six) hours as needed for up to 7 days.   rivaroxaban 10 MG Tabs tablet Commonly known as: Xarelto Take 1 tablet (10 mg total) by mouth daily. For 3 weeks then resume aspirin 81mg  daily.   tamsulosin 0.4 MG Caps capsule Commonly known as: FLOMAX Take 0.4 mg by mouth daily.   Vitamin D 50 MCG (2000 UT) Caps Take 6,000 Units by mouth daily.   zolpidem 10 MG tablet Commonly known as: AMBIEN Take 10 mg by mouth at bedtime.            Discharge Care Instructions  (From admission, onward)         Start     Ordered   03/26/19 0000  Weight bearing as tolerated     03/26/19 0914   03/26/19 0000  Change dressing    Comments: Remove  bulky dressing on Wednesday. Leave adhesive bandage in place until one week following surgery.   03/26/19 0914         Follow-up Information    Gaynelle Arabian, MD. Schedule an appointment as soon as possible for a visit on 04/10/2019.   Specialty: Orthopedic Surgery Contact information: [address] [place] [address] [phone]           Signed: Theresa Duty, PA-C Orthopedic Surgery 03/28/2019, 7:29 AM

## 2019-04-02 DIAGNOSIS — R2681 Unsteadiness on feet: Secondary | ICD-10-CM | POA: Diagnosis not present

## 2019-04-02 DIAGNOSIS — M25562 Pain in left knee: Secondary | ICD-10-CM | POA: Diagnosis not present

## 2019-04-02 DIAGNOSIS — M6281 Muscle weakness (generalized): Secondary | ICD-10-CM | POA: Diagnosis not present

## 2019-04-02 DIAGNOSIS — R2689 Other abnormalities of gait and mobility: Secondary | ICD-10-CM | POA: Diagnosis not present

## 2019-04-02 DIAGNOSIS — M25662 Stiffness of left knee, not elsewhere classified: Secondary | ICD-10-CM | POA: Diagnosis not present

## 2019-04-02 DIAGNOSIS — M79662 Pain in left lower leg: Secondary | ICD-10-CM | POA: Diagnosis not present

## 2019-04-02 DIAGNOSIS — M1712 Unilateral primary osteoarthritis, left knee: Secondary | ICD-10-CM | POA: Diagnosis not present

## 2019-04-09 DIAGNOSIS — R2689 Other abnormalities of gait and mobility: Secondary | ICD-10-CM | POA: Diagnosis not present

## 2019-04-09 DIAGNOSIS — M25662 Stiffness of left knee, not elsewhere classified: Secondary | ICD-10-CM | POA: Diagnosis not present

## 2019-04-09 DIAGNOSIS — R2681 Unsteadiness on feet: Secondary | ICD-10-CM | POA: Diagnosis not present

## 2019-04-09 DIAGNOSIS — M1712 Unilateral primary osteoarthritis, left knee: Secondary | ICD-10-CM | POA: Diagnosis not present

## 2019-04-09 DIAGNOSIS — M6281 Muscle weakness (generalized): Secondary | ICD-10-CM | POA: Diagnosis not present

## 2019-04-09 DIAGNOSIS — M25562 Pain in left knee: Secondary | ICD-10-CM | POA: Diagnosis not present

## 2019-04-09 DIAGNOSIS — Z96652 Presence of left artificial knee joint: Secondary | ICD-10-CM | POA: Diagnosis not present

## 2019-04-09 DIAGNOSIS — M79662 Pain in left lower leg: Secondary | ICD-10-CM | POA: Diagnosis not present

## 2019-05-01 DIAGNOSIS — Z471 Aftercare following joint replacement surgery: Secondary | ICD-10-CM | POA: Diagnosis not present

## 2019-05-01 DIAGNOSIS — Z96652 Presence of left artificial knee joint: Secondary | ICD-10-CM | POA: Diagnosis not present

## 2019-06-22 DIAGNOSIS — J310 Chronic rhinitis: Secondary | ICD-10-CM | POA: Diagnosis not present

## 2019-06-22 DIAGNOSIS — G4700 Insomnia, unspecified: Secondary | ICD-10-CM | POA: Diagnosis not present

## 2019-06-22 DIAGNOSIS — G4733 Obstructive sleep apnea (adult) (pediatric): Secondary | ICD-10-CM | POA: Diagnosis not present

## 2019-07-02 DIAGNOSIS — R197 Diarrhea, unspecified: Secondary | ICD-10-CM | POA: Diagnosis not present

## 2019-07-26 DIAGNOSIS — E039 Hypothyroidism, unspecified: Secondary | ICD-10-CM | POA: Diagnosis not present

## 2019-07-26 DIAGNOSIS — Z0000 Encounter for general adult medical examination without abnormal findings: Secondary | ICD-10-CM | POA: Diagnosis not present

## 2019-07-26 DIAGNOSIS — Z125 Encounter for screening for malignant neoplasm of prostate: Secondary | ICD-10-CM | POA: Diagnosis not present

## 2019-07-26 DIAGNOSIS — E782 Mixed hyperlipidemia: Secondary | ICD-10-CM | POA: Diagnosis not present

## 2019-07-26 DIAGNOSIS — F321 Major depressive disorder, single episode, moderate: Secondary | ICD-10-CM | POA: Diagnosis not present

## 2019-07-26 DIAGNOSIS — F5101 Primary insomnia: Secondary | ICD-10-CM | POA: Diagnosis not present

## 2019-07-26 DIAGNOSIS — N429 Disorder of prostate, unspecified: Secondary | ICD-10-CM | POA: Diagnosis not present

## 2019-08-23 DIAGNOSIS — G4733 Obstructive sleep apnea (adult) (pediatric): Secondary | ICD-10-CM | POA: Diagnosis not present

## 2019-08-23 DIAGNOSIS — Z720 Tobacco use: Secondary | ICD-10-CM | POA: Diagnosis not present

## 2019-08-23 DIAGNOSIS — E782 Mixed hyperlipidemia: Secondary | ICD-10-CM | POA: Diagnosis not present

## 2019-08-23 DIAGNOSIS — R001 Bradycardia, unspecified: Secondary | ICD-10-CM | POA: Diagnosis not present

## 2019-09-06 DIAGNOSIS — F1020 Alcohol dependence, uncomplicated: Secondary | ICD-10-CM | POA: Diagnosis not present

## 2019-09-14 DIAGNOSIS — G4700 Insomnia, unspecified: Secondary | ICD-10-CM | POA: Diagnosis not present

## 2019-09-14 DIAGNOSIS — J310 Chronic rhinitis: Secondary | ICD-10-CM | POA: Diagnosis not present

## 2019-09-14 DIAGNOSIS — G4733 Obstructive sleep apnea (adult) (pediatric): Secondary | ICD-10-CM | POA: Diagnosis not present

## 2019-11-25 DIAGNOSIS — Z20822 Contact with and (suspected) exposure to covid-19: Secondary | ICD-10-CM | POA: Diagnosis not present

## 2019-11-25 DIAGNOSIS — R509 Fever, unspecified: Secondary | ICD-10-CM | POA: Diagnosis not present

## 2019-12-21 DIAGNOSIS — G4733 Obstructive sleep apnea (adult) (pediatric): Secondary | ICD-10-CM | POA: Diagnosis not present

## 2019-12-21 DIAGNOSIS — G4700 Insomnia, unspecified: Secondary | ICD-10-CM | POA: Diagnosis not present

## 2019-12-21 DIAGNOSIS — J310 Chronic rhinitis: Secondary | ICD-10-CM | POA: Diagnosis not present

## 2020-01-16 DIAGNOSIS — G4733 Obstructive sleep apnea (adult) (pediatric): Secondary | ICD-10-CM | POA: Diagnosis not present

## 2020-01-25 DIAGNOSIS — E782 Mixed hyperlipidemia: Secondary | ICD-10-CM | POA: Diagnosis not present

## 2020-01-25 DIAGNOSIS — F1011 Alcohol abuse, in remission: Secondary | ICD-10-CM | POA: Diagnosis not present

## 2020-01-25 DIAGNOSIS — E039 Hypothyroidism, unspecified: Secondary | ICD-10-CM | POA: Diagnosis not present

## 2020-01-25 DIAGNOSIS — Z23 Encounter for immunization: Secondary | ICD-10-CM | POA: Diagnosis not present

## 2020-01-25 DIAGNOSIS — F321 Major depressive disorder, single episode, moderate: Secondary | ICD-10-CM | POA: Diagnosis not present

## 2020-01-31 DIAGNOSIS — E7800 Pure hypercholesterolemia, unspecified: Secondary | ICD-10-CM | POA: Diagnosis not present

## 2020-01-31 DIAGNOSIS — F329 Major depressive disorder, single episode, unspecified: Secondary | ICD-10-CM | POA: Diagnosis not present

## 2020-01-31 DIAGNOSIS — E039 Hypothyroidism, unspecified: Secondary | ICD-10-CM | POA: Diagnosis not present

## 2020-01-31 DIAGNOSIS — Z20822 Contact with and (suspected) exposure to covid-19: Secondary | ICD-10-CM | POA: Diagnosis not present

## 2020-01-31 DIAGNOSIS — Z7982 Long term (current) use of aspirin: Secondary | ICD-10-CM | POA: Diagnosis not present

## 2020-01-31 DIAGNOSIS — Z79899 Other long term (current) drug therapy: Secondary | ICD-10-CM | POA: Diagnosis not present

## 2020-01-31 DIAGNOSIS — F1010 Alcohol abuse, uncomplicated: Secondary | ICD-10-CM | POA: Diagnosis not present

## 2020-02-01 DIAGNOSIS — F1020 Alcohol dependence, uncomplicated: Secondary | ICD-10-CM | POA: Diagnosis not present

## 2020-02-11 DIAGNOSIS — H2513 Age-related nuclear cataract, bilateral: Secondary | ICD-10-CM | POA: Diagnosis not present

## 2020-02-11 DIAGNOSIS — H5203 Hypermetropia, bilateral: Secondary | ICD-10-CM | POA: Diagnosis not present

## 2020-02-26 DIAGNOSIS — F1020 Alcohol dependence, uncomplicated: Secondary | ICD-10-CM | POA: Diagnosis not present

## 2020-03-01 DIAGNOSIS — G4733 Obstructive sleep apnea (adult) (pediatric): Secondary | ICD-10-CM | POA: Diagnosis not present

## 2020-03-04 DIAGNOSIS — F1020 Alcohol dependence, uncomplicated: Secondary | ICD-10-CM | POA: Diagnosis not present

## 2020-03-11 DIAGNOSIS — F1020 Alcohol dependence, uncomplicated: Secondary | ICD-10-CM | POA: Diagnosis not present

## 2020-03-17 DIAGNOSIS — J310 Chronic rhinitis: Secondary | ICD-10-CM | POA: Diagnosis not present

## 2020-03-17 DIAGNOSIS — G4733 Obstructive sleep apnea (adult) (pediatric): Secondary | ICD-10-CM | POA: Diagnosis not present

## 2020-03-17 DIAGNOSIS — G4700 Insomnia, unspecified: Secondary | ICD-10-CM | POA: Diagnosis not present

## 2020-03-24 DIAGNOSIS — G5601 Carpal tunnel syndrome, right upper limb: Secondary | ICD-10-CM | POA: Diagnosis not present

## 2020-03-24 DIAGNOSIS — M25531 Pain in right wrist: Secondary | ICD-10-CM | POA: Diagnosis not present

## 2020-03-24 DIAGNOSIS — G5602 Carpal tunnel syndrome, left upper limb: Secondary | ICD-10-CM | POA: Diagnosis not present

## 2020-03-24 DIAGNOSIS — F1020 Alcohol dependence, uncomplicated: Secondary | ICD-10-CM | POA: Diagnosis not present

## 2020-03-24 DIAGNOSIS — M25532 Pain in left wrist: Secondary | ICD-10-CM | POA: Diagnosis not present

## 2020-03-24 DIAGNOSIS — G5603 Carpal tunnel syndrome, bilateral upper limbs: Secondary | ICD-10-CM | POA: Diagnosis not present

## 2020-03-28 DIAGNOSIS — J309 Allergic rhinitis, unspecified: Secondary | ICD-10-CM | POA: Diagnosis not present

## 2020-03-28 DIAGNOSIS — Z20828 Contact with and (suspected) exposure to other viral communicable diseases: Secondary | ICD-10-CM | POA: Diagnosis not present

## 2020-05-09 DIAGNOSIS — G4733 Obstructive sleep apnea (adult) (pediatric): Secondary | ICD-10-CM | POA: Diagnosis not present

## 2020-06-13 DIAGNOSIS — G4733 Obstructive sleep apnea (adult) (pediatric): Secondary | ICD-10-CM | POA: Diagnosis not present

## 2020-07-28 DIAGNOSIS — F1020 Alcohol dependence, uncomplicated: Secondary | ICD-10-CM | POA: Diagnosis not present

## 2020-07-28 DIAGNOSIS — F321 Major depressive disorder, single episode, moderate: Secondary | ICD-10-CM | POA: Diagnosis not present

## 2020-07-28 DIAGNOSIS — E782 Mixed hyperlipidemia: Secondary | ICD-10-CM | POA: Diagnosis not present

## 2020-07-28 DIAGNOSIS — J301 Allergic rhinitis due to pollen: Secondary | ICD-10-CM | POA: Diagnosis not present

## 2020-07-28 DIAGNOSIS — N400 Enlarged prostate without lower urinary tract symptoms: Secondary | ICD-10-CM | POA: Diagnosis not present

## 2020-07-28 DIAGNOSIS — G4700 Insomnia, unspecified: Secondary | ICD-10-CM | POA: Diagnosis not present

## 2020-07-28 DIAGNOSIS — Z7289 Other problems related to lifestyle: Secondary | ICD-10-CM | POA: Diagnosis not present

## 2020-07-28 DIAGNOSIS — Z818 Family history of other mental and behavioral disorders: Secondary | ICD-10-CM | POA: Diagnosis not present

## 2020-07-28 DIAGNOSIS — Z8249 Family history of ischemic heart disease and other diseases of the circulatory system: Secondary | ICD-10-CM | POA: Diagnosis not present

## 2020-07-28 DIAGNOSIS — F5101 Primary insomnia: Secondary | ICD-10-CM | POA: Diagnosis not present

## 2020-07-28 DIAGNOSIS — Z0000 Encounter for general adult medical examination without abnormal findings: Secondary | ICD-10-CM | POA: Diagnosis not present

## 2020-07-28 DIAGNOSIS — F329 Major depressive disorder, single episode, unspecified: Secondary | ICD-10-CM | POA: Diagnosis not present

## 2020-07-28 DIAGNOSIS — E039 Hypothyroidism, unspecified: Secondary | ICD-10-CM | POA: Diagnosis not present

## 2020-07-28 DIAGNOSIS — Z809 Family history of malignant neoplasm, unspecified: Secondary | ICD-10-CM | POA: Diagnosis not present

## 2020-08-12 DIAGNOSIS — G4733 Obstructive sleep apnea (adult) (pediatric): Secondary | ICD-10-CM | POA: Diagnosis not present

## 2020-08-26 DIAGNOSIS — E782 Mixed hyperlipidemia: Secondary | ICD-10-CM | POA: Diagnosis not present

## 2020-08-26 DIAGNOSIS — Z720 Tobacco use: Secondary | ICD-10-CM | POA: Diagnosis not present

## 2020-08-26 DIAGNOSIS — F1020 Alcohol dependence, uncomplicated: Secondary | ICD-10-CM | POA: Diagnosis not present

## 2020-08-26 DIAGNOSIS — R001 Bradycardia, unspecified: Secondary | ICD-10-CM | POA: Diagnosis not present

## 2020-08-26 DIAGNOSIS — G4733 Obstructive sleep apnea (adult) (pediatric): Secondary | ICD-10-CM | POA: Diagnosis not present

## 2020-09-15 DIAGNOSIS — G4700 Insomnia, unspecified: Secondary | ICD-10-CM | POA: Diagnosis not present

## 2020-09-15 DIAGNOSIS — G4733 Obstructive sleep apnea (adult) (pediatric): Secondary | ICD-10-CM | POA: Diagnosis not present

## 2020-09-15 DIAGNOSIS — Z79899 Other long term (current) drug therapy: Secondary | ICD-10-CM | POA: Diagnosis not present

## 2020-09-15 DIAGNOSIS — J310 Chronic rhinitis: Secondary | ICD-10-CM | POA: Diagnosis not present

## 2020-10-01 DIAGNOSIS — I371 Nonrheumatic pulmonary valve insufficiency: Secondary | ICD-10-CM | POA: Diagnosis not present

## 2020-10-01 DIAGNOSIS — F1020 Alcohol dependence, uncomplicated: Secondary | ICD-10-CM | POA: Diagnosis not present

## 2020-10-01 DIAGNOSIS — R001 Bradycardia, unspecified: Secondary | ICD-10-CM | POA: Diagnosis not present

## 2020-10-07 DIAGNOSIS — F1020 Alcohol dependence, uncomplicated: Secondary | ICD-10-CM | POA: Diagnosis not present

## 2020-11-18 DIAGNOSIS — G4733 Obstructive sleep apnea (adult) (pediatric): Secondary | ICD-10-CM | POA: Diagnosis not present

## 2020-11-25 DIAGNOSIS — G4733 Obstructive sleep apnea (adult) (pediatric): Secondary | ICD-10-CM | POA: Diagnosis not present

## 2020-12-16 DIAGNOSIS — G4733 Obstructive sleep apnea (adult) (pediatric): Secondary | ICD-10-CM | POA: Diagnosis not present

## 2020-12-16 DIAGNOSIS — Z79899 Other long term (current) drug therapy: Secondary | ICD-10-CM | POA: Diagnosis not present

## 2020-12-16 DIAGNOSIS — J310 Chronic rhinitis: Secondary | ICD-10-CM | POA: Diagnosis not present

## 2020-12-16 DIAGNOSIS — G4700 Insomnia, unspecified: Secondary | ICD-10-CM | POA: Diagnosis not present

## 2021-01-28 DIAGNOSIS — G4733 Obstructive sleep apnea (adult) (pediatric): Secondary | ICD-10-CM | POA: Diagnosis not present

## 2021-02-13 DIAGNOSIS — E039 Hypothyroidism, unspecified: Secondary | ICD-10-CM | POA: Diagnosis not present

## 2021-02-13 DIAGNOSIS — F1011 Alcohol abuse, in remission: Secondary | ICD-10-CM | POA: Diagnosis not present

## 2021-02-13 DIAGNOSIS — F321 Major depressive disorder, single episode, moderate: Secondary | ICD-10-CM | POA: Diagnosis not present

## 2021-02-13 DIAGNOSIS — J301 Allergic rhinitis due to pollen: Secondary | ICD-10-CM | POA: Diagnosis not present

## 2021-02-13 DIAGNOSIS — F5101 Primary insomnia: Secondary | ICD-10-CM | POA: Diagnosis not present

## 2021-02-13 DIAGNOSIS — Z23 Encounter for immunization: Secondary | ICD-10-CM | POA: Diagnosis not present

## 2021-02-13 DIAGNOSIS — E782 Mixed hyperlipidemia: Secondary | ICD-10-CM | POA: Diagnosis not present

## 2021-02-13 DIAGNOSIS — H6023 Malignant otitis externa, bilateral: Secondary | ICD-10-CM | POA: Diagnosis not present

## 2021-02-16 DIAGNOSIS — E039 Hypothyroidism, unspecified: Secondary | ICD-10-CM | POA: Diagnosis not present

## 2021-02-17 DIAGNOSIS — E039 Hypothyroidism, unspecified: Secondary | ICD-10-CM | POA: Diagnosis not present

## 2021-03-17 DIAGNOSIS — Z79899 Other long term (current) drug therapy: Secondary | ICD-10-CM | POA: Diagnosis not present

## 2021-03-17 DIAGNOSIS — G4733 Obstructive sleep apnea (adult) (pediatric): Secondary | ICD-10-CM | POA: Diagnosis not present

## 2021-03-17 DIAGNOSIS — J310 Chronic rhinitis: Secondary | ICD-10-CM | POA: Diagnosis not present

## 2021-03-17 DIAGNOSIS — G4700 Insomnia, unspecified: Secondary | ICD-10-CM | POA: Diagnosis not present

## 2022-03-08 ENCOUNTER — Telehealth (HOSPITAL_COMMUNITY): Payer: Self-pay | Admitting: Licensed Clinical Social Worker

## 2022-03-08 NOTE — Telephone Encounter (Signed)
The therapist attempts to reach Cameron Jackson concerning a referral received by Cameron Jackson with [facility] to obtain clarification for the reason that he is being referred; however, his voicemail box is full.  Myrna Blazer, MA, LCSW, [facility], LCAS 03/08/2022

## 2023-03-28 ENCOUNTER — Encounter (HOSPITAL_BASED_OUTPATIENT_CLINIC_OR_DEPARTMENT_OTHER): Payer: Self-pay | Admitting: Emergency Medicine

## 2023-03-28 ENCOUNTER — Ambulatory Visit (HOSPITAL_BASED_OUTPATIENT_CLINIC_OR_DEPARTMENT_OTHER)
Admission: EM | Admit: 2023-03-28 | Discharge: 2023-03-28 | Disposition: A | Discharge status: Home / Self Care | Payer: PPO | Attending: Internal Medicine | Admitting: Internal Medicine

## 2023-03-28 DIAGNOSIS — I8002 Phlebitis and thrombophlebitis of superficial vessels of left lower extremity: Secondary | ICD-10-CM | POA: Diagnosis not present

## 2023-03-28 NOTE — ED Triage Notes (Signed)
Pt c/o reddened area to inner left thigh, warm to touch an it hurts when he walks x 3-4 days.

## 2023-03-28 NOTE — ED Provider Notes (Signed)
[name]    CSN: [id number] Arrival date & time: 03/28/23  1315      History   Chief Complaint Chief Complaint  Patient presents with   redness to inner leg    HPI Cameron Jackson is a 72 y.o. male.   The history is provided by the patient.   Tender, red, warm area medial left thigh noted 1 week ago Denies injury Admits recent new exercise weight resistance program Denies for, chills, sweats.  Has had URI symptoms for over a week  Past Medical History:  Diagnosis Date   Arthritis    Osteo Arthritis   Complication of anesthesia    hypothermia- 20 years ago.  Aggessive behavior - with `endo' years ago.   Hyperlipidemia    Hypertension    not on medication now. 140/80s is high for patient.   Hypothyroidism    Mental disorder    Sleep apnea    CPAP   [facility])    "mini" stroke per MRI    Patient Active Problem List   Diagnosis Date Noted   OA (osteoarthritis) of knee 03/26/2019   Osteoarthritis of left knee 03/26/2019   Cervical stenosis of spinal canal 02/28/2013   Dyspnea 07/15/2010    Past Surgical History:  Procedure Laterality Date   ANTERIOR CERVICAL DECOMPRESSION/DISCECTOMY FUSION 4 LEVELS N/A 02/28/2013   Procedure: ANTERIOR CERVICAL DECOMPRESSION/DISCECTOMY FUSION 4 LEVELS;  Surgeon: Karn Cassis, MD;  Location: [facility];  Service: Neurosurgery;  Laterality: N/A;  C3-4 C4-5 C5-6 C6-7 Anterior cervical decompression/diskectomy/fusion   COLONOSCOPY     KNEE SURGERY  [phone]   left   TONSILLECTOMY     TOTAL KNEE ARTHROPLASTY Left 03/26/2019   Procedure: TOTAL KNEE ARTHROPLASTY;  Surgeon: Ollen Gross, MD;  Location: [facility] ORS;  Service: Orthopedics;  Laterality: Left;        Home Medications    Prior to Admission medications   Medication Sig Start Date End Date Taking? Authorizing Provider  atorvastatin (LIPITOR) 40 MG tablet Take 40 mg by mouth daily.    [provider]  cetirizine (ZYRTEC) 10 MG  tablet Take 10 mg by mouth at bedtime.    [provider]  Cholecalciferol (VITAMIN D) 50 MCG (2000 UT) CAPS Take 6,000 Units by mouth daily.     [provider]  escitalopram (LEXAPRO) 10 MG tablet Take 10 mg by mouth 3 (three) times daily.     [provider]  gabapentin (NEURONTIN) 300 MG capsule Take 1 capsule (300 mg total) by mouth 3 (three) times daily. Then a 300 mg capsule twice a day for two weeks, Then a 300 mg capsule once a day for two weeks, then discontinue the Gabapentin. 03/26/19 05/07/19  Porterfield, Amber, PA-C  levothyroxine (SYNTHROID, LEVOTHROID) 125 MCG tablet Take 125 mcg by mouth daily before breakfast.    [provider]  methocarbamol (ROBAXIN) 500 MG tablet Take 1 tablet (500 mg total) by mouth 4 (four) times daily. 03/26/19   Porterfield, Amber, PA-C  rivaroxaban (XARELTO) 10 MG TABS tablet Take 1 tablet (10 mg total) by mouth daily. For 3 weeks then resume aspirin 81mg  daily. 03/26/19   Porterfield, Amber, PA-C  tamsulosin (FLOMAX) 0.4 MG CAPS capsule Take 0.4 mg by mouth daily.     [provider]  zolpidem (AMBIEN) 10 MG tablet Take 10 mg by mouth at bedtime.    [provider]    Family History Family History  Problem Relation Age of  Onset   Allergies Father    Heart disease Father    Allergies Brother    Heart disease Mother     Social History Social History   Tobacco Use   Smoking status: Former    Current packs/day: 0.00    Average packs/day: 2.0 packs/day for 11.0 years (22.0 ttl pk-yrs)    Types: Cigarettes    Start date: 04/05/1970    Quit date: 04/05/1981    Years since quitting: 42.0   Smokeless tobacco: Former    Types: Chew    Quit date: 04/05/2004  Vaping Use   Vaping status: Never Used  Substance Use Topics   Alcohol use: Yes    Comment: occ   Drug use: No     Allergies   Other and Rivaroxaban   Review of Systems Review of Systems  Constitutional:  Negative for chills and  fever.  HENT:  Positive for congestion and rhinorrhea. Negative for sore throat.   Respiratory:  Positive for cough. Negative for shortness of breath.   Cardiovascular:  Negative for chest pain.  Skin:  Positive for color change.  Neurological:  Negative for weakness and numbness.     Physical Exam Triage Vital Signs ED Triage Vitals  Encounter Vitals Group     BP 03/28/23 1330 136/79     Systolic BP Percentile --      Diastolic BP Percentile --      Pulse Rate 03/28/23 1330 (!) 56     Resp 03/28/23 1330 18     Temp 03/28/23 1330 97.9 F (36.6 C)     Temp Source 03/28/23 1330 Oral     SpO2 03/28/23 1330 98 %     Weight --      Height --      Head Circumference --      Peak Flow --      Pain Score 03/28/23 1329 3     Pain Loc --      Pain Education --      Exclude from Growth Chart --    No data found.  Updated Vital Signs BP 136/79 (BP Location: Right Arm)   Pulse (!) 56   Temp 97.9 F (36.6 C) (Oral)   Resp 18   SpO2 98%   Visual Acuity Right Eye Distance:   Left Eye Distance:   Bilateral Distance:    Right Eye Near:   Left Eye Near:    Bilateral Near:     Physical Exam Vitals reviewed.  HENT:     Head: Normocephalic.  Skin:    General: Skin is warm and dry.     Findings: Erythema present.     Comments: Palpable indurated cord left medial lower leg with faint overlying erythema, reports mild tenderness  Neurological:     Mental Status: He is alert and oriented to person, place, and time.      UC Treatments / Results  Labs (all labs ordered are listed, but only abnormal results are displayed) Labs Reviewed - No data to display  EKG   Radiology No results found.  Procedures Procedures (including critical care time)  Medications Ordered in UC Medications - No data to display  Initial Impression / Assessment and Plan / UC Course  I have reviewed the triage vital signs and the nursing notes.  Pertinent labs & imaging results that were  available during my care of the patient were reviewed by me and considered in my medical decision making (see chart for  details).    72 year old male with red warm area medial left leg consistent with superficial thrombophlebitis Takes 81 mg aspirin recommend he switch to regular strength aspirin 325 mg 3 times daily, apply warm compresses and use compression hose Follow-up with PCP Final Clinical Impressions(s) / UC Diagnoses   Final diagnoses:  Thrombophlebitis of superficial veins of left lower extremity   Discharge Instructions   None    ED Prescriptions   None    PDMP not reviewed this encounter.   Meliton Rattan, [place] 03/28/23 1342

## 2023-06-19 ENCOUNTER — Encounter (HOSPITAL_BASED_OUTPATIENT_CLINIC_OR_DEPARTMENT_OTHER): Payer: Self-pay | Admitting: *Deleted

## 2023-06-19 ENCOUNTER — Ambulatory Visit (HOSPITAL_BASED_OUTPATIENT_CLINIC_OR_DEPARTMENT_OTHER): Admission: EM | Admit: 2023-06-19 | Discharge: 2023-06-19 | Disposition: A | Discharge status: Home / Self Care

## 2023-06-19 DIAGNOSIS — R051 Acute cough: Secondary | ICD-10-CM

## 2023-06-19 MED ORDER — AZITHROMYCIN 250 MG PO TABS: 250.0000 mg | ORAL_TABLET | Freq: Every day | ORAL | 0 refills | Status: AC

## 2023-06-19 NOTE — Discharge Instructions (Addendum)
 Treating you for potential infection.  Take the antibiotics as prescribed. Recommend over-the-counter Mucinex You can also do a daily allergy pill if not already Follow up as needed

## 2023-06-19 NOTE — ED Provider Notes (Signed)
 [facility]    CSN: [id number] Arrival date & time: 06/19/23  1034      History   Chief Complaint Chief Complaint  Patient presents with   Cough    HPI Cameron Jackson is a 73 y.o. male.   Patient is a 73 year old male presents today with cough, congestion with green sputum, ear pain.  This has been present for the past 2 weeks.  Reports over the past couple days he is felt more fatigued and some mild shortness of breath.  Hard to take a deep breath.  No reported fevers.   Cough   Past Medical History:  Diagnosis Date   Arthritis    Osteo Arthritis   Complication of anesthesia    hypothermia- 20 years ago.  Aggessive behavior - with `endo' years ago.   Hyperlipidemia    Hypertension    not on medication now. 140/80s is high for patient.   Hypothyroidism    Mental disorder    Sleep apnea    CPAP   Stroke [facility])    "mini" stroke per MRI    Patient Active Problem List   Diagnosis Date Noted   OA (osteoarthritis) of knee 03/26/2019   Osteoarthritis of left knee 03/26/2019   Cervical stenosis of spinal canal 02/28/2013   Dyspnea 07/15/2010    Past Surgical History:  Procedure Laterality Date   ANTERIOR CERVICAL DECOMPRESSION/DISCECTOMY FUSION 4 LEVELS N/A 02/28/2013   Procedure: ANTERIOR CERVICAL DECOMPRESSION/DISCECTOMY FUSION 4 LEVELS;  Surgeon: Cameron Cassis, MD;  Location: [facility];  Service: Neurosurgery;  Laterality: N/A;  C3-4 C4-5 C5-6 C6-7 Anterior cervical decompression/diskectomy/fusion   COLONOSCOPY     KNEE SURGERY  [phone]   left   TONSILLECTOMY     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 Authorizing Provider  aspirin EC 81 MG tablet Take by mouth. 08/23/19  Yes [provider]  azithromycin (ZITHROMAX) 250 MG tablet Take 1 tablet (250 mg total) by mouth daily. Take first 2 tablets together, then 1 every day until finished. 06/19/23  Yes Kameryn Tisdel A, FNP  cetirizine (ZYRTEC) 10 MG tablet Take 10 mg by mouth at bedtime.    [provider]  Cholecalciferol (VITAMIN D) 50 MCG (2000 UT) CAPS Take 6,000 Units by mouth daily.     [provider]  escitalopram (LEXAPRO) 10 MG tablet Take 10 mg by mouth 3 (three) times daily.     [provider]  levothyroxine (SYNTHROID, LEVOTHROID) 125 MCG tablet Take 125 mcg by mouth daily before breakfast.    [provider]  tamsulosin (FLOMAX) 0.4 MG CAPS capsule Take 0.4 mg by mouth daily.     [provider]  zolpidem (AMBIEN) 10 MG tablet Take 10 mg by mouth at bedtime.    [provider]    Family History Family History  Problem Relation Age of Onset   Allergies Father    Heart disease Father    Allergies Brother    Heart disease Mother     Social History Social History   Tobacco Use   Smoking status: Former    Current packs/day: 0.00    Average packs/day: 2.0 packs/day for 11.0 years (22.0 ttl pk-yrs)  Types: Cigarettes    Start date: 04/05/1970    Quit date: 04/05/1981    Years since quitting: 42.2   Smokeless tobacco: Current    Types: Chew    Last attempt to quit: 04/05/2004  Vaping Use   Vaping status: Never Used  Substance Use Topics   Alcohol use: Yes    Comment: occ   Drug use: No     Allergies   Other and Rivaroxaban   Review of Systems Review of Systems  Respiratory:  Positive for cough.      Physical Exam Triage Vital Signs ED Triage Vitals  Encounter Vitals Group     BP 06/19/23 1048 112/75     Systolic BP Percentile --      Diastolic BP Percentile --      Pulse Rate 06/19/23 1048 93     Resp 06/19/23 1048 18     Temp 06/19/23 1048 98.3 F (36.8 C)     Temp Source 06/19/23 1048 Oral     SpO2 06/19/23 1048 95 %     Weight  06/19/23 1044 258 lb (117 kg)     Height 06/19/23 1044 6\' 4"  (1.93 m)     Head Circumference --      Peak Flow --      Pain Score 06/19/23 1044 0     Pain Loc --      Pain Education --      Exclude from Growth Chart --    No data found.  Updated Vital Signs BP 112/75 (BP Location: Right Arm)   Pulse 93   Temp 98.3 F (36.8 C) (Oral)   Resp 18   Ht 6\' 4"  (1.93 m)   Wt 258 lb (117 kg)   SpO2 95%   BMI 31.40 kg/m   Visual Acuity Right Eye Distance:   Left Eye Distance:   Bilateral Distance:    Right Eye Near:   Left Eye Near:    Bilateral Near:     Physical Exam Constitutional:      General: He is not in acute distress.    Appearance: Normal appearance. He is not toxic-appearing.  HENT:     Right Ear: Tympanic membrane, ear canal and external ear normal.     Left Ear: Tympanic membrane is erythematous.     Nose: Congestion present.     Mouth/Throat:     Mouth: Mucous membranes are moist.     Pharynx: Oropharynx is clear.  Eyes:     Conjunctiva/sclera: Conjunctivae normal.  Cardiovascular:     Rate and Rhythm: Normal rate and regular rhythm.     Heart sounds: Normal heart sounds.  Pulmonary:     Effort: Pulmonary effort is normal.     Comments: Decreased lung sounds LLL. Mild wheeze  Skin:    General: Skin is warm and dry.  Neurological:     Mental Status: He is alert.  Psychiatric:        Mood and Affect: Mood normal.      UC Treatments / Results  Labs (all labs ordered are listed, but only abnormal results are displayed) Labs Reviewed - No data to display  EKG   Radiology No results found.  Procedures Procedures (including critical care time)  Medications Ordered in UC Medications - No data to display  Initial Impression / Assessment and Plan / UC Course  I have reviewed the triage vital signs and the nursing notes.  Pertinent labs & imaging results that were available  during my care of the patient were reviewed by me and considered in my  medical decision making (see chart for details).     Cough-based on length of symptoms and exam covering for possible infection.  Azithromycin as prescribed.  Recommend Mucinex over-the-counter and allergy medicine. Follow-up for any continued issues Final Clinical Impressions(s) / UC Diagnoses   Final diagnoses:  Acute cough     Discharge Instructions      Treating you for potential infection.  Take the antibiotics as prescribed. Recommend over-the-counter Mucinex You can also do a daily allergy pill if not already Follow up as needed     ED Prescriptions     Medication Sig Dispense Auth. Provider   azithromycin (ZITHROMAX) 250 MG tablet Take 1 tablet (250 mg total) by mouth daily. Take first 2 tablets together, then 1 every day until finished. 6 tablet Janace Aris, FNP      PDMP not reviewed this encounter.   Janace Aris, FNP 06/19/23 1109

## 2023-06-19 NOTE — ED Triage Notes (Signed)
 Patient states at least 2 weeks of cough with green sputum, ears feel hot inside but not painful.  Not taking OTC cough med

## 2023-09-14 ENCOUNTER — Ambulatory Visit (HOSPITAL_BASED_OUTPATIENT_CLINIC_OR_DEPARTMENT_OTHER): Admitting: Radiology

## 2023-09-14 ENCOUNTER — Ambulatory Visit (HOSPITAL_BASED_OUTPATIENT_CLINIC_OR_DEPARTMENT_OTHER): Admission: EM | Admit: 2023-09-14 | Discharge: 2023-09-14 | Disposition: A | Discharge status: Home / Self Care

## 2023-09-14 ENCOUNTER — Encounter (HOSPITAL_BASED_OUTPATIENT_CLINIC_OR_DEPARTMENT_OTHER): Payer: Self-pay

## 2023-09-14 DIAGNOSIS — R051 Acute cough: Secondary | ICD-10-CM

## 2023-09-14 DIAGNOSIS — R0781 Pleurodynia: Secondary | ICD-10-CM

## 2023-09-14 NOTE — Discharge Instructions (Signed)
 Your x-ray was normal without any concerns of infection.  This may be just some inflammation in the muscles around the rib area.  You can take anti-inflammatories for this like ibuprofen or Voltaren gel over-the-counter to the area. For cough you can do Mucinex over-the-counter Continue the allergy medication. Follow-up as needed

## 2023-09-14 NOTE — ED Triage Notes (Signed)
 Onset yesterday of left rib cage pain. Patient reports since onset of that pain, he has developed a cough. Pneumonia in January. Pain with deep breath.

## 2023-09-15 NOTE — ED Provider Notes (Signed)
 [facility]    CSN: [id number] Arrival date & time: 09/14/23  1528      History   Chief Complaint Chief Complaint  Patient presents with   pain to rib cage    HPI [facility] is a 73 y.o. male.   Patient is a 73 year old male who presents today with pain to the left rib cage.  This started yesterday.  He is also developed a cough and low-grade fever.  Was seen here back in March and diagnosed with pneumonia.  He is having pain with taking a deep breath.  Denies any shortness of breath.  Denies any injuries.     Past Medical History:  Diagnosis Date   Arthritis    Osteo Arthritis   Complication of anesthesia    hypothermia- 20 years ago.  Aggessive behavior - with `endo' years ago.   Hyperlipidemia    Hypertension    not on medication now. 140/80s is high for patient.   Hypothyroidism    Mental disorder    Sleep apnea    CPAP   Stroke [facility])    mini stroke per MRI    Patient Active Problem List   Diagnosis Date Noted   OA (osteoarthritis) of knee 03/26/2019   Osteoarthritis of left knee 03/26/2019   Cervical stenosis of spinal canal 02/28/2013   Dyspnea 07/15/2010    Past Surgical History:  Procedure Laterality Date   ANTERIOR CERVICAL DECOMPRESSION/DISCECTOMY FUSION 4 LEVELS N/A 02/28/2013   Procedure: ANTERIOR CERVICAL DECOMPRESSION/DISCECTOMY FUSION 4 LEVELS;  Surgeon: Adelbert Adler, MD;  Location: [facility];  Service: Neurosurgery;  Laterality: N/A;  C3-4 C4-5 C5-6 C6-7 Anterior cervical decompression/diskectomy/fusion   COLONOSCOPY     KNEE SURGERY  [phone]   left   TONSILLECTOMY     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 Authorizing Provider  buPROPion (WELLBUTRIN XL) 150 MG 24 hr  tablet Take 150 mg by mouth daily. 07/11/23 07/10/24 Yes [provider]  levothyroxine  (SYNTHROID ) 137 MCG tablet Take 137 mcg by mouth daily before breakfast.   Yes [provider]  aspirin  EC 81 MG tablet Take by mouth. 08/23/19   [provider]  azithromycin  (ZITHROMAX ) 250 MG tablet Take 1 tablet (250 mg total) by mouth daily. Take first 2 tablets together, then 1 every day until finished. 06/19/23   Landa Pine, FNP  cetirizine (ZYRTEC) 10 MG tablet Take 10 mg by mouth at bedtime.    [provider]  Cholecalciferol (VITAMIN D) 50 MCG (2000 UT) CAPS Take 6,000 Units by mouth daily.     [provider]  escitalopram  (LEXAPRO ) 10 MG tablet Take 10 mg by mouth daily.    [provider]  levothyroxine  (SYNTHROID , LEVOTHROID) 125 MCG tablet Take 139 mcg by mouth daily before breakfast.    [provider]  tamsulosin  (FLOMAX ) 0.4 MG CAPS capsule Take 0.4 mg by mouth daily.     [provider]  zolpidem  (AMBIEN ) 10 MG tablet Take 10 mg by mouth at bedtime.    [provider]    Family History Family History  Problem Relation Age of Onset   Allergies Father    Heart disease Father    Allergies Brother  Heart disease Mother     Social History Social History   Tobacco Use   Smoking status: Former    Current packs/day: 0.00    Average packs/day: 2.0 packs/day for 11.0 years (22.0 ttl pk-yrs)    Types: Cigarettes    Start date: 04/05/1970    Quit date: 04/05/1981    Years since quitting: 42.4   Smokeless tobacco: Current    Types: Chew    Last attempt to quit: 04/05/2004  Vaping Use   Vaping status: Never Used  Substance Use Topics   Alcohol use: Yes    Comment: occ   Drug use: No     Allergies   Other and Rivaroxaban    Review of Systems Review of Systems  See HPI Physical Exam Triage Vital Signs ED Triage Vitals  Encounter Vitals Group     BP 09/14/23 1546 131/83     Girls Systolic BP  Percentile --      Girls Diastolic BP Percentile --      Boys Systolic BP Percentile --      Boys Diastolic BP Percentile --      Pulse Rate 09/14/23 1546 78     Resp 09/14/23 1546 20     Temp 09/14/23 1546 99.4 F (37.4 C)     Temp Source 09/14/23 1546 Oral     SpO2 09/14/23 1546 93 %     Weight --      Height --      Head Circumference --      Peak Flow --      Pain Score 09/14/23 1548 5     Pain Loc --      Pain Education --      Exclude from Growth Chart --    No data found.  Updated Vital Signs BP 131/83 (BP Location: Right Arm)   Pulse 78   Temp 99.4 F (37.4 C) (Oral)   Resp 20   SpO2 93%   Visual Acuity Right Eye Distance:   Left Eye Distance:   Bilateral Distance:    Right Eye Near:   Left Eye Near:    Bilateral Near:     Physical Exam Constitutional:      Appearance: Normal appearance.   Cardiovascular:     Rate and Rhythm: Normal rate and regular rhythm.  Pulmonary:     Effort: Pulmonary effort is normal.     Breath sounds: Normal breath sounds.  Chest:    Musculoskeletal:        General: Normal range of motion.   Neurological:     Mental Status: He is alert.   Psychiatric:        Mood and Affect: Mood normal.      UC Treatments / Results  Labs (all labs ordered are listed, but only abnormal results are displayed) Labs Reviewed - No data to display  EKG   Radiology DG Ribs Unilateral W/Chest Left Result Date: 09/14/2023 CLINICAL DATA:  Cough.  Chest wall pain. EXAM: LEFT RIBS AND CHEST - 3+ VIEW COMPARISON:  Chest radiograph dated 10/08/2017. FINDINGS: Minimal left lung base atelectasis. No focal consolidation, pleural effusion or pneumothorax. The cardiac silhouette is within normal limits. No acute osseous pathology. No displaced rib fractures. IMPRESSION: 1. No acute cardiopulmonary process. 2. No displaced rib fractures. Electronically Signed   By: Angus Bark M.D.   On: 09/14/2023 16:35    Procedures Procedures  (including critical care time)  Medications Ordered in UC Medications - No data  to display  Initial Impression / Assessment and Plan / UC Course  I have reviewed the triage vital signs and the nursing notes.  Pertinent labs & imaging results that were available during my care of the patient were reviewed by me and considered in my medical decision making (see chart for details).     Acute cough with rib pain-no concerns on x-ray.  Patient likely has some sort of virus with  costochondritis. ? Pleurisy although this did not present on xray.   Low-grade fever here today.  Recommended anti-inflammatories like ibuprofen or he can do Voltaren gel over-the-counter to the area.  Recommend Mucinex for cough and congestion.  Continue allergy medication follow-up with your doctor as needed Final Clinical Impressions(s) / UC Diagnoses   Final diagnoses:  Rib pain  Acute cough     Discharge Instructions      Your x-ray was normal without any concerns of infection.  This may be just some inflammation in the muscles around the rib area.  You can take anti-inflammatories for this like ibuprofen or Voltaren gel over-the-counter to the area. For cough you can do Mucinex over-the-counter Continue the allergy medication. Follow-up as needed    ED Prescriptions   None    PDMP not reviewed this encounter.   Landa Pine, FNP 09/15/23 [phone]
